# Patient Record
Sex: Female | Born: 1990 | Race: Black or African American | Hispanic: No | Marital: Single | Smoking: Current every day smoker
Health system: Southern US, Community
[De-identification: ages and names within clinical notes are randomized; demographics above are authoritative.]

## PROBLEM LIST (undated history)

## (undated) DIAGNOSIS — N611 Abscess of the breast and nipple: Secondary | ICD-10-CM

## (undated) DIAGNOSIS — E669 Obesity, unspecified: Secondary | ICD-10-CM

## (undated) DIAGNOSIS — E559 Vitamin D deficiency, unspecified: Secondary | ICD-10-CM

## (undated) HISTORY — DX: Vitamin D deficiency, unspecified: E55.9

## (undated) HISTORY — DX: Obesity, unspecified: E66.9

## (undated) HISTORY — PX: WISDOM TOOTH EXTRACTION: SHX21

---
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
## 2012-11-13 ENCOUNTER — Inpatient Hospital Stay (HOSPITAL_COMMUNITY)
Admission: AD | Admit: 2012-11-13 | Discharge: 2012-11-13 | Discharge status: Against Medical Advice | Payer: BC Managed Care – PPO | Source: Ambulatory Visit | Attending: Family Medicine | Admitting: Family Medicine

## 2012-11-13 DIAGNOSIS — N61 Mastitis without abscess: Secondary | ICD-10-CM | POA: Insufficient documentation

## 2012-11-13 NOTE — MAU Note (Signed)
Pt called from the lobby; No response.

## 2012-11-13 NOTE — MAU Note (Signed)
Pt called from lobby no response 

## 2012-11-13 NOTE — MAU Note (Signed)
Patient states she was diagnosed with an abscess on the left breast on 7-11. Was put on antibiotics and told to find someone to drain the abscess. Patient states she is having pain. Had a nipple piercing about 1 month ago on the left breast.

## 2012-11-13 NOTE — MAU Note (Signed)
Pt called to the lobby; no response.

## 2012-11-19 ENCOUNTER — Ambulatory Visit (INDEPENDENT_AMBULATORY_CARE_PROVIDER_SITE_OTHER): Payer: BC Managed Care – PPO | Admitting: Surgery

## 2012-11-19 ENCOUNTER — Encounter (HOSPITAL_COMMUNITY): Payer: Self-pay | Admitting: *Deleted

## 2012-11-19 ENCOUNTER — Encounter (INDEPENDENT_AMBULATORY_CARE_PROVIDER_SITE_OTHER): Payer: Self-pay | Admitting: Surgery

## 2012-11-19 ENCOUNTER — Other Ambulatory Visit (HOSPITAL_COMMUNITY): Payer: Self-pay | Admitting: *Deleted

## 2012-11-19 VITALS — BP 118/86 | HR 107 | Temp 96.8°F | Resp 16 | Ht 62.0 in | Wt 243.0 lb

## 2012-11-19 DIAGNOSIS — N611 Abscess of the breast and nipple: Secondary | ICD-10-CM | POA: Insufficient documentation

## 2012-11-19 DIAGNOSIS — N61 Mastitis without abscess: Secondary | ICD-10-CM

## 2012-11-19 NOTE — Progress Notes (Signed)
General Surgery - [facility]  Chief Complaint  Patient presents with  . New Evaluation    left breast abscess    HISTORY: Patient is a 22-year-old female who presents with a left breast abscess. Patient apparently had a piercing performed of the left nipple in early June 2014. She developed symptoms in early July and presented to student [facility] on 11/08/2012. She was started on oral Bactrim and referred to radiology. In [place], [place], she underwent an ultrasound of the left breast which showed a multiloculated extensive abscess.  The patient returned to [place] and was seen at [facility] on 11/09/2012.  She was referred to surgery for evaluation and management. Apparently, do to scheduling constraints, the patient did not come to our office until today. She continues to take Bactrim twice daily. She has left the piercing in place in the left nipple.  History reviewed. No pertinent past medical history.  Current Outpatient Prescriptions  Medication Sig Dispense Refill  . norgestimate-ethinyl estradiol (ORTHO-CYCLEN,SPRINTEC,PREVIFEM) 0.25-35 MG-MCG tablet Take 1 tablet by mouth daily.      . sulfamethoxazole-trimethoprim (BACTRIM DS) 800-160 MG per tablet Take 1 tablet by mouth 2 (two) times daily.       No current facility-administered medications for this visit.    No Known Allergies  History reviewed. No pertinent family history.  History   Social History  . Marital Status: Single    Spouse Name: N/A    Number of Children: N/A  . Years of Education: N/A   Social History Main Topics  . Smoking status: Current Every Day Smoker    Types: Cigarettes  . Smokeless tobacco: None  . Alcohol Use: Yes     Comment: occ  . Drug Use: No  . Sexually Active: None   Other Topics Concern  . None   Social History Narrative  . None    REVIEW OF SYSTEMS - PERTINENT POSITIVES ONLY: Patient notes some discomfort in the left breast. No prior history  of breast disease. No drainage. She denies fevers or chills.  EXAM: Filed Vitals:   11/19/12 1532  BP: 118/86  Pulse: 107  Temp: 96.8 F (36 C)  Resp: 16    HEENT: normocephalic; pupils equal and reactive; sclerae clear; dentition good; mucous membranes moist NECK:  symmetric on extension; no palpable anterior or posterior cervical lymphadenopathy; no supraclavicular masses; no tenderness CHEST: clear to auscultation bilaterally without rales, rhonchi, or wheezes CARDIAC: regular rate and rhythm without significant murmur; peripheral pulses are full BREAST: Right breast shows a normal nipple areolar complex; breast tissue is diffusely nodular without discrete or dominant mass; no tenderness. Left breast has a piercing in the nipple; left breast is approximately twice the size of the right breast; there is diffuse induration; there is a fluctuant area at the lateral aspect of the left areola; there is no drainage; there is mild tenderness EXT:  non-tender without edema; no deformity NEURO: no gross focal deficits; no sign of tremor   LABORATORY RESULTS: See [facility] (CHL-Epic) for most recent results  RADIOLOGY RESULTS: See [facility] (CHL-Epic) for most recent results  IMPRESSION: Left breast abscess, extensive, multiloculated  PLAN: I discussed with the patient the need for surgical drainage and debridement. I explained that this would need to be done under general anesthesia. She will likely require open wound dressing changes postoperatively.  I discussed the patient with my partner, Dr. Matthew Martin, who will perform the procedure tomorrow. We will make arrangements for   surgery.  The risks and benefits of the procedure have been discussed at length with the patient.  The patient understands the proposed procedure, potential alternative treatments, and the course of recovery to be expected.  All of the patient's questions have been answered at this time.  The  patient wishes to proceed with surgery.  Geneen Dieter M. Hermila Millis, MD, FACS General & Endocrine Surgery [facility]  Primary Care Physician: No primary provider on file.   

## 2012-11-19 NOTE — Patient Instructions (Signed)
°

## 2012-11-20 ENCOUNTER — Ambulatory Visit (HOSPITAL_COMMUNITY)
Admission: RE | Admit: 2012-11-20 | Discharge: 2012-11-20 | Disposition: A | Discharge status: Home / Self Care | Payer: BC Managed Care – PPO | Source: Ambulatory Visit | Attending: Surgery | Admitting: Surgery

## 2012-11-20 ENCOUNTER — Encounter (HOSPITAL_COMMUNITY): Payer: Self-pay | Admitting: *Deleted

## 2012-11-20 ENCOUNTER — Encounter (HOSPITAL_COMMUNITY)
Admission: RE | Disposition: A | Discharge status: Home / Self Care | Payer: Self-pay | Source: Ambulatory Visit | Attending: Surgery

## 2012-11-20 ENCOUNTER — Encounter (HOSPITAL_COMMUNITY): Payer: Self-pay | Admitting: Anesthesiology

## 2012-11-20 ENCOUNTER — Ambulatory Visit (HOSPITAL_COMMUNITY): Payer: BC Managed Care – PPO | Admitting: Anesthesiology

## 2012-11-20 DIAGNOSIS — N61 Mastitis without abscess: Secondary | ICD-10-CM | POA: Insufficient documentation

## 2012-11-20 DIAGNOSIS — F17200 Nicotine dependence, unspecified, uncomplicated: Secondary | ICD-10-CM | POA: Insufficient documentation

## 2012-11-20 HISTORY — DX: Abscess of the breast and nipple: N61.1

## 2012-11-20 HISTORY — PX: INCISION AND DRAINAGE ABSCESS: SHX5864

## 2012-11-20 LAB — CBC
HCT: 37.5 % (ref 36.0–46.0)
Hemoglobin: 12 g/dL (ref 12.0–15.0)
MCHC: 32 g/dL (ref 30.0–36.0)
RBC: 4.59 MIL/uL (ref 3.87–5.11)
WBC: 11.2 10*3/uL — ABNORMAL HIGH (ref 4.0–10.5)

## 2012-11-20 SURGERY — INCISION AND DRAINAGE, ABSCESS
Anesthesia: General | Site: Breast | Laterality: Left | Wound class: Dirty or Infected

## 2012-11-20 MED ORDER — SODIUM CHLORIDE 0.9 % IJ SOLN: 3.0000 mL | Freq: Two times a day (BID) | INTRAMUSCULAR | Status: DC

## 2012-11-20 MED ORDER — LIDOCAINE HCL (CARDIAC) 20 MG/ML IV SOLN
INTRAVENOUS | Status: DC | PRN
  Administered 2012-11-20: 100 mg via INTRAVENOUS

## 2012-11-20 MED ORDER — KETAMINE HCL 10 MG/ML IJ SOLN
INTRAMUSCULAR | Status: DC | PRN
  Administered 2012-11-20: 20 mg via INTRAVENOUS

## 2012-11-20 MED ORDER — LACTATED RINGERS IV SOLN
INTRAVENOUS | Status: DC
  Administered 2012-11-20: 1000 mL via INTRAVENOUS
  Administered 2012-11-20: 12:00:00 via INTRAVENOUS

## 2012-11-20 MED ORDER — ONDANSETRON HCL 4 MG/2ML IJ SOLN
INTRAMUSCULAR | Status: DC | PRN
  Administered 2012-11-20: 4 mg via INTRAVENOUS

## 2012-11-20 MED ORDER — SODIUM CHLORIDE 0.9 % IV SOLN: 250.0000 mL | INTRAVENOUS | Status: DC | PRN

## 2012-11-20 MED ORDER — MIDAZOLAM HCL 5 MG/5ML IJ SOLN
INTRAMUSCULAR | Status: DC | PRN
  Administered 2012-11-20: 2 mg via INTRAVENOUS

## 2012-11-20 MED ORDER — 0.9 % SODIUM CHLORIDE (POUR BTL) OPTIME
TOPICAL | Status: DC | PRN
  Administered 2012-11-20: 1000 mL

## 2012-11-20 MED ORDER — FENTANYL CITRATE 0.05 MG/ML IJ SOLN
INTRAMUSCULAR | Status: DC | PRN
  Administered 2012-11-20: 50 ug via INTRAVENOUS
  Administered 2012-11-20: 100 ug via INTRAVENOUS
  Administered 2012-11-20: 50 ug via INTRAVENOUS

## 2012-11-20 MED ORDER — PROPOFOL 10 MG/ML IV BOLUS
INTRAVENOUS | Status: DC | PRN
  Administered 2012-11-20: 160 mg via INTRAVENOUS

## 2012-11-20 MED ORDER — HYDROMORPHONE HCL PF 1 MG/ML IJ SOLN
INTRAMUSCULAR | Status: AC
  Filled 2012-11-20: qty 1

## 2012-11-20 MED ORDER — OXYCODONE HCL 5 MG PO TABS: 5.0000 mg | ORAL_TABLET | ORAL | Status: DC | PRN

## 2012-11-20 MED ORDER — OXYCODONE-ACETAMINOPHEN 5-325 MG PO TABS: 1.0000 | ORAL_TABLET | ORAL | Status: DC | PRN

## 2012-11-20 MED ORDER — LACTATED RINGERS IV SOLN
INTRAVENOUS | Status: DC | PRN
  Administered 2012-11-20: 10:00:00 via INTRAVENOUS

## 2012-11-20 MED ORDER — ACETAMINOPHEN 325 MG PO TABS: 650.0000 mg | ORAL_TABLET | ORAL | Status: DC | PRN

## 2012-11-20 MED ORDER — ONDANSETRON HCL 4 MG/2ML IJ SOLN: 4.0000 mg | Freq: Four times a day (QID) | INTRAMUSCULAR | Status: DC | PRN

## 2012-11-20 MED ORDER — ACETAMINOPHEN 650 MG RE SUPP
650.0000 mg | RECTAL | Status: DC | PRN
  Filled 2012-11-20: qty 1

## 2012-11-20 MED ORDER — HYDROMORPHONE HCL PF 1 MG/ML IJ SOLN
0.2500 mg | INTRAMUSCULAR | Status: DC | PRN
  Administered 2012-11-20: 0.25 mg via INTRAVENOUS

## 2012-11-20 MED ORDER — PROMETHAZINE HCL 25 MG/ML IJ SOLN: 6.2500 mg | INTRAMUSCULAR | Status: DC | PRN

## 2012-11-20 MED ORDER — CEFAZOLIN SODIUM-DEXTROSE 2-3 GM-% IV SOLR
INTRAVENOUS | Status: AC
  Filled 2012-11-20: qty 50

## 2012-11-20 MED ORDER — SODIUM CHLORIDE 0.9 % IJ SOLN: 3.0000 mL | INTRAMUSCULAR | Status: DC | PRN

## 2012-11-20 MED ORDER — BUPIVACAINE-EPINEPHRINE PF 0.25-1:200000 % IJ SOLN
INTRAMUSCULAR | Status: AC
  Filled 2012-11-20: qty 30

## 2012-11-20 MED ORDER — CEFAZOLIN SODIUM-DEXTROSE 2-3 GM-% IV SOLR: 2.0000 g | INTRAVENOUS | Status: DC

## 2012-11-20 SURGICAL SUPPLY — 33 items
BANDAGE GAUZE ELAST BULKY 4 IN (GAUZE/BANDAGES/DRESSINGS) IMPLANT
BLADE SURG 15 STRL LF DISP TIS (BLADE) ×1 IMPLANT
BLADE SURG 15 STRL SS (BLADE) ×2
CANISTER SUCTION 2500CC (MISCELLANEOUS) ×2 IMPLANT
CLOTH BEACON ORANGE TIMEOUT ST (SAFETY) ×2 IMPLANT
COVER SURGICAL LIGHT HANDLE (MISCELLANEOUS) ×2 IMPLANT
DECANTER SPIKE VIAL GLASS SM (MISCELLANEOUS) IMPLANT
DRAPE LAPAROSCOPIC ABDOMINAL (DRAPES) IMPLANT
DRSG PAD ABDOMINAL 8X10 ST (GAUZE/BANDAGES/DRESSINGS) IMPLANT
ELECT CAUTERY BLADE 6.4 (BLADE) ×2 IMPLANT
ELECT REM PT RETURN 9FT ADLT (ELECTROSURGICAL) ×2
ELECTRODE REM PT RTRN 9FT ADLT (ELECTROSURGICAL) ×1 IMPLANT
GAUZE PACKING IODOFORM 1/2 (PACKING) ×1 IMPLANT
GLOVE BIO SURGEON STRL SZ7.5 (GLOVE) ×2 IMPLANT
GOWN STRL NON-REIN LRG LVL3 (GOWN DISPOSABLE) ×4 IMPLANT
KIT BASIN OR (CUSTOM PROCEDURE TRAY) ×2 IMPLANT
NDL HYPO 25X1 1.5 SAFETY (NEEDLE) IMPLANT
NEEDLE HYPO 25X1 1.5 SAFETY (NEEDLE) IMPLANT
NS IRRIG 1000ML POUR BTL (IV SOLUTION) ×2 IMPLANT
PENCIL BUTTON HOLSTER BLD 10FT (ELECTRODE) ×2 IMPLANT
SPONGE GAUZE 4X4 12PLY (GAUZE/BANDAGES/DRESSINGS) ×1 IMPLANT
SPONGE LAP 18X18 X RAY DECT (DISPOSABLE) ×2 IMPLANT
SUT MNCRL AB 4-0 PS2 18 (SUTURE) IMPLANT
SUT VIC AB 3-0 SH 27 (SUTURE)
SUT VIC AB 3-0 SH 27XBRD (SUTURE) IMPLANT
SWAB COLLECTION DEVICE MRSA (MISCELLANEOUS) IMPLANT
SYR BULB 3OZ (MISCELLANEOUS) ×2 IMPLANT
SYR CONTROL 10ML LL (SYRINGE) IMPLANT
TAPE CLOTH SURG 4X10 WHT LF (GAUZE/BANDAGES/DRESSINGS) ×1 IMPLANT
TOWEL OR 17X26 10 PK STRL BLUE (TOWEL DISPOSABLE) ×2 IMPLANT
TUBE ANAEROBIC SPECIMEN COL (MISCELLANEOUS) IMPLANT
WATER STERILE IRR 1000ML POUR (IV SOLUTION) IMPLANT
YANKAUER SUCT BULB TIP NO VENT (SUCTIONS) ×2 IMPLANT

## 2012-11-20 NOTE — Anesthesia Postprocedure Evaluation (Signed)
Anesthesia Post Note  Patient: Kristie [facility]  Procedure(s) Performed: Procedure(s) (LRB): INCISION AND DRAINAGE LEFT BREAST ABSCESS (Left)  Anesthesia type: General  Patient location: PACU  Post pain: Pain level controlled  Post assessment: Post-op Vital signs reviewed  Last Vitals: BP 123/82  Pulse 68  Temp(Src) 36.2 C (Oral)  Resp 16  Ht 5\' 1"  (1.549 m)  Wt 242 lb 8 oz (109.997 kg)  BMI 45.84 kg/m2  SpO2 99%  LMP 11/17/2012  Post vital signs: Reviewed  Level of consciousness: sedated  Complications: No apparent anesthesia complications

## 2012-11-20 NOTE — Op Note (Signed)
Surgeon: Wenda Low, MD, FACS  Asst:  none  Anes:  general  Procedure: Incision and drainage and culture of large left breast abscess;  Removal of nipple piercing;  Packed with 1/2 inch Iodophor  Diagnosis: Left breast abscess  Complications: none  EBL:   minimal cc  Description of Procedure:  Taken to room 6.  Timeout after nipple ring removed without difficulty.  Prepped with PCMX and 18 gauge aspiration for yellow thick pus performed and aerobic and anaerobic cultures taken.  Incision and large quatitiy of yellow non foul smelling pus removed.  Irrigated with saline and packed with 1/2 inch Iodophor.    Plan: Discharge to home with pain meds and continue antibiotics.  Return to Urgent Office on Thursday for packing removal.    [name], MD, [facility], [place] [phone]

## 2012-11-20 NOTE — Interval H&P Note (Signed)
History and Physical Interval Note:  11/20/2012 10:07 AM  Kristie [facility]  has presented today for surgery, with the diagnosis of left breast abscess  The various methods of treatment have been discussed with the patient and family. After consideration of risks, benefits and other options for treatment, the patient has consented to  Procedure(s): INCISION AND DRAINAGE LEFT BREAST ABSCESS (Left) as a surgical intervention .  The patient's history has been reviewed, patient examined, no change in status, stable for surgery.  I have reviewed the patient's chart and labs.  Questions were answered to the patient's satisfaction.     Bevely Hackbart B

## 2012-11-20 NOTE — Anesthesia Preprocedure Evaluation (Addendum)
Anesthesia Evaluation  Patient identified by MRN, date of birth, ID band Patient awake    Reviewed: Allergy & Precautions, H&P , NPO status , Patient's Chart, lab work & pertinent test results  Airway Mallampati: II TM Distance: >3 FB Neck ROM: Full    Dental no notable dental hx. (+) Dental Advisory Given   Pulmonary Current Smoker,  breath sounds clear to auscultation  Pulmonary exam normal       Cardiovascular Exercise Tolerance: Good negative cardio ROS  Rhythm:Regular Rate:Normal     Neuro/Psych negative neurological ROS  negative psych ROS   GI/Hepatic negative GI ROS, Neg liver ROS,   Endo/Other  Morbid obesity  Renal/GU negative Renal ROS     Musculoskeletal negative musculoskeletal ROS (+)   Abdominal (+) + obese,   Peds  Hematology negative hematology ROS (+)   Anesthesia Other Findings   Reproductive/Obstetrics negative OB ROS                          Anesthesia Physical Anesthesia Plan  ASA: III  Anesthesia Plan: General   Post-op Pain Management:    Induction: Intravenous  Airway Management Planned: LMA  Additional Equipment:   Intra-op Plan:   Post-operative Plan: Extubation in OR  Informed Consent: I have reviewed the patients History and Physical, chart, labs and discussed the procedure including the risks, benefits and alternatives for the proposed anesthesia with the patient or authorized representative who has indicated his/her understanding and acceptance.   Dental advisory given  Plan Discussed with: CRNA  Anesthesia Plan Comments:        Anesthesia Quick Evaluation

## 2012-11-20 NOTE — H&P (View-Only) (Signed)
[facility]  Chief Complaint  Patient presents with  . New Evaluation    left breast abscess    HISTORY: Patient is a 22 year old female who presents with a left breast abscess. Patient apparently had a piercing performed of the left nipple in early June 2014. She developed symptoms in early July and presented to student health on 11/08/2012. She was started on oral Bactrim and referred to radiology. In [place], [place], she underwent an ultrasound of the left breast which showed a multiloculated extensive abscess.  The patient returned to [place] [facility] and was seen at [facility] on 11/09/2012.  She was referred to surgery for evaluation and management. Apparently, do to scheduling constraints, the patient did not come to our office until today. She continues to take Bactrim twice daily. She has left the piercing in place in the left nipple.  History reviewed. No pertinent past medical history.  Current Outpatient Prescriptions  Medication Sig Dispense Refill  . norgestimate-ethinyl estradiol (ORTHO-CYCLEN,SPRINTEC,PREVIFEM) 0.25-35 MG-MCG tablet Take 1 tablet by mouth daily.      Marland Kitchen sulfamethoxazole-trimethoprim (BACTRIM DS) 800-160 MG per tablet Take 1 tablet by mouth 2 (two) times daily.       No current facility-administered medications for this visit.    No Known Allergies  History reviewed. No pertinent family history.  History   Social History  . Marital Status: Single    Spouse Name: N/A    Number of Children: N/A  . Years of Education: N/A   Social History Main Topics  . Smoking status: Current Every Day Smoker    Types: Cigarettes  . Smokeless tobacco: None  . Alcohol Use: Yes     Comment: occ  . Drug Use: No  . Sexually Active: None   Other Topics Concern  . None   Social History Narrative  . None    REVIEW OF SYSTEMS - PERTINENT POSITIVES ONLY: Patient notes some discomfort in the left breast. No prior history  of breast disease. No drainage. She denies fevers or chills.  EXAM: Filed Vitals:   11/19/12 1532  BP: 118/86  Pulse: 107  Temp: 96.8 F (36 C)  Resp: 16    HEENT: normocephalic; pupils equal and reactive; sclerae clear; dentition good; mucous membranes moist NECK:  symmetric on extension; no palpable anterior or posterior cervical lymphadenopathy; no supraclavicular masses; no tenderness CHEST: clear to auscultation bilaterally without rales, rhonchi, or wheezes CARDIAC: regular rate and rhythm without significant murmur; peripheral pulses are full BREAST: Right breast shows a normal nipple areolar complex; breast tissue is diffusely nodular without discrete or dominant mass; no tenderness. Left breast has a piercing in the nipple; left breast is approximately twice the size of the right breast; there is diffuse induration; there is a fluctuant area at the lateral aspect of the left areola; there is no drainage; there is mild tenderness EXT:  non-tender without edema; no deformity NEURO: no gross focal deficits; no sign of tremor   LABORATORY RESULTS: See [facility] (CHL-Epic) for most recent results  RADIOLOGY RESULTS: See [facility] (CHL-Epic) for most recent results  IMPRESSION: Left breast abscess, extensive, multiloculated  PLAN: I discussed with the patient the need for surgical drainage and debridement. I explained that this would need to be done under general anesthesia. She will likely require open wound dressing changes postoperatively.  I discussed the patient with my partner, Dr. Luretha Murphy, who will perform the procedure tomorrow. We will make arrangements for  surgery.  The risks and benefits of the procedure have been discussed at length with the patient.  The patient understands the proposed procedure, potential alternative treatments, and the course of recovery to be expected.  All of the patient's questions have been answered at this time.  The  patient wishes to proceed with surgery.  Velora Heckler, MD, FACS General & Endocrine Surgery [facility]  Primary Care Physician: No primary provider on file.

## 2012-11-20 NOTE — Transfer of Care (Signed)
Immediate Anesthesia Transfer of Care Note  Patient: Kristie [facility]  Procedure(s) Performed: Procedure(s): INCISION AND DRAINAGE LEFT BREAST ABSCESS (Left)  Patient Location: PACU  Anesthesia Type:General  Level of Consciousness: sedated  Airway & Oxygen Therapy: Patient Spontanous Breathing and Patient connected to face mask oxygen  Post-op Assessment: Report given to PACU RN and Post -op Vital signs reviewed and stable  Post vital signs: Reviewed and stable  Complications: No apparent anesthesia complications

## 2012-11-21 ENCOUNTER — Encounter (HOSPITAL_COMMUNITY): Payer: Self-pay | Admitting: Surgery

## 2012-11-22 ENCOUNTER — Encounter (INDEPENDENT_AMBULATORY_CARE_PROVIDER_SITE_OTHER): Payer: Self-pay | Admitting: General Surgery

## 2012-11-22 ENCOUNTER — Ambulatory Visit (INDEPENDENT_AMBULATORY_CARE_PROVIDER_SITE_OTHER): Payer: BC Managed Care – PPO | Admitting: General Surgery

## 2012-11-22 VITALS — BP 103/62 | HR 64 | Temp 97.7°F | Resp 14 | Ht 62.0 in | Wt 241.0 lb

## 2012-11-22 DIAGNOSIS — N61 Mastitis without abscess: Secondary | ICD-10-CM

## 2012-11-22 DIAGNOSIS — N611 Abscess of the breast and nipple: Secondary | ICD-10-CM

## 2012-11-22 NOTE — Progress Notes (Signed)
Kristie Wolf is a 22 y.o. female who is here for a follow up visit regarding her L breast abscess.  She was found to have an abscess and was taken to the OR for drainage.  She is here today for packing removal.    Objective: Filed Vitals:   11/22/12 1635  BP: 103/62  Pulse: 64  Temp: 97.7 F (36.5 C)  Resp: 14    General appearance: alert and cooperative L breast without erythema or fluctuance.  Packing removed without difficulty   Assessment and Plan: Appears to be healing well.  Pt given instructions to wash area with soap and water daily and keep covered.  F/U with Dr Daphine Deutscher in 2 weeks.      Vanita Panda, MD [facility], [place] [phone]

## 2012-11-22 NOTE — Patient Instructions (Signed)
Keep area covered until it stops draining.  Ok to shower but no tub baths or swimming for 2 weeks.  Call the office if you pain gets worse or you start to run fevers or have increased drainage.

## 2012-11-23 LAB — CULTURE, ROUTINE-ABSCESS

## 2012-11-25 LAB — ANAEROBIC CULTURE

## 2012-12-07 ENCOUNTER — Encounter (INDEPENDENT_AMBULATORY_CARE_PROVIDER_SITE_OTHER): Payer: Self-pay | Admitting: Surgery

## 2012-12-07 ENCOUNTER — Ambulatory Visit (INDEPENDENT_AMBULATORY_CARE_PROVIDER_SITE_OTHER): Payer: BC Managed Care – PPO | Admitting: Surgery

## 2012-12-07 VITALS — BP 112/58 | HR 72 | Resp 14 | Ht 63.5 in | Wt 245.4 lb

## 2012-12-07 DIAGNOSIS — Z872 Personal history of diseases of the skin and subcutaneous tissue: Secondary | ICD-10-CM

## 2012-12-07 NOTE — Patient Instructions (Addendum)
Keep Hibiclens on hand to wash armpits, beneath breasts, groins when hot & sweaty.

## 2012-12-07 NOTE — Progress Notes (Signed)
Kristie Wolf 22 y.o.  Body mass index is 42.78 kg/(m^2).  There are no active problems to display for this patient.   No Known Allergies  Past Surgical History  Procedure Laterality Date  . Incision and drainage abscess Left 11/20/2012    Procedure: INCISION AND DRAINAGE LEFT BREAST ABSCESS;  Surgeon: Valarie Merino, MD;  Location: [facility] ORS;  Service: General;  Laterality: Left;   No primary provider on file. No diagnosis found.  Left breast where abscess was has healed and is no longer draining.  Nontender to palpation.   Doing well.  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   [name], MD, [facility] [phone] beeper [phone]  12/07/2012 3:29 PM

## 2018-10-29 ENCOUNTER — Encounter: Attending: Family Medicine | Primary: Family Medicine

## 2018-11-07 ENCOUNTER — Ambulatory Visit: Attending: Family Medicine | Primary: Family Medicine

## 2018-11-07 ENCOUNTER — Inpatient Hospital Stay: Admit: 2018-11-08 | Payer: BLUE CROSS/BLUE SHIELD | Primary: Family Medicine

## 2018-11-07 ENCOUNTER — Ambulatory Visit: Admit: 2018-11-07 | Payer: PRIVATE HEALTH INSURANCE | Attending: Family Medicine | Primary: Family Medicine

## 2018-11-07 ENCOUNTER — Inpatient Hospital Stay: Admit: 2018-11-07 | Payer: BLUE CROSS/BLUE SHIELD | Primary: Family Medicine

## 2018-11-07 DIAGNOSIS — Z01419 Encounter for gynecological examination (general) (routine) without abnormal findings: Secondary | ICD-10-CM

## 2018-11-07 LAB — BASIC METABOLIC PANEL
Anion Gap: 4 mmol/L — ABNORMAL LOW (ref 5–15)
BUN: 8 MG/DL (ref 6–20)
Bun/Cre Ratio: 11 — ABNORMAL LOW (ref 12–20)
CO2: 24 mmol/L (ref 21–32)
Calcium: 8.2 MG/DL — ABNORMAL LOW (ref 8.5–10.1)
Chloride: 107 mmol/L (ref 97–108)
Creatinine: 0.7 MG/DL (ref 0.55–1.02)
EGFR IF NonAfrican American: >60 mL/min/{1.73_m2} (ref >60)
GFR African American: >60 mL/min/{1.73_m2} (ref >60)
Glucose: 67 mg/dL (ref 65–100)
Potassium: 4.4 mmol/L (ref 3.5–5.1)
Sodium: 135 mmol/L — ABNORMAL LOW (ref 136–145)

## 2018-11-07 LAB — LIPID PANEL
CHOL/HDL Ratio: 3.2 (ref 0.0–5.0)
Chol/HDL Ratio: 3.2 (ref 0.0–5.0)
Cholesterol, Total: 176 MG/DL (ref <200)
Cholesterol, total: 176 MG/DL (ref <200)
HDL Cholesterol: 55 MG/DL
HDL: 55 MG/DL
LDL Calculated: 105.8 MG/DL — ABNORMAL HIGH (ref 0–100)
LDL, calculated: 105.8 MG/DL — ABNORMAL HIGH (ref 0–100)
Triglyceride: 76 MG/DL (ref <150)
Triglycerides: 76 MG/DL (ref <150)
VLDL Cholesterol Calculated: 15.2 MG/DL
VLDL, calculated: 15.2 MG/DL

## 2018-11-07 LAB — METABOLIC PANEL, BASIC
Anion gap: 4 mmol/L — ABNORMAL LOW (ref 5–15)
BUN/Creatinine ratio: 11 — ABNORMAL LOW (ref 12–20)
BUN: 8 MG/DL (ref 6–20)
CO2: 24 mmol/L (ref 21–32)
Calcium: 8.2 MG/DL — ABNORMAL LOW (ref 8.5–10.1)
Chloride: 107 mmol/L (ref 97–108)
Creatinine: 0.7 MG/DL (ref 0.55–1.02)
GFR est AA: >60 mL/min/{1.73_m2} (ref >60)
GFR est non-AA: >60 mL/min/{1.73_m2} (ref >60)
Glucose: 67 mg/dL (ref 65–100)
Potassium: 4.4 mmol/L (ref 3.5–5.1)
Sodium: 135 mmol/L — ABNORMAL LOW (ref 136–145)

## 2018-11-07 MED ORDER — FLUCONAZOLE 150 MG TAB
150 mg | ORAL_TABLET | Freq: Every day | ORAL | 0 refills | Status: AC
Start: 2018-11-07 — End: 2018-11-08

## 2018-11-07 NOTE — Progress Notes (Signed)
No additional comment

## 2018-11-07 NOTE — Progress Notes (Signed)
Identified pt with two pt identifiers(name and DOB). Reviewed record in preparation for visit and have obtained necessary documentation.  Chief Complaint   Patient presents with   ??? Establish Care   ??? Physical        Health Maintenance Due   Topic   ??? HPV Age 9Y-26Y (1 - 2-dose series)   ??? DTaP/Tdap/Td series (1 - Tdap)   ??? PAP AKA CERVICAL CYTOLOGY        Coordination of Care Questionnaire:  :   1) Have you been to an emergency room, urgent care, or hospitalized since your last visit?  If yes, where when, and reason for visit? no      2. Have seen or consulted any other health care provider since your last visit?   If yes, where when, and reason for visit?  no        Patient is accompanied by self  I have received verbal consent from Mindy Kirk to discuss any/all medical information while they are present in the room.

## 2018-11-07 NOTE — Progress Notes (Signed)
[facility]        Well Woman Check Up       Subjective:     28 y.o. African American F for routine annual exam    Pertinent past medical history: no history of HTN, DVT, CAD, DM, liver disease, migraines or smoking.    LMP:Patient's last menstrual period was 10/09/2018 (exact date).  Menses:regular       PAP History:   Due today    Colonoscopy: No, routine screening    Mammogram:  Due at 42    DEXA: due at 67    Lipids:  Due now      Family History   Problem Relation Age of Onset   ??? No Known Problems Mother    ??? Diabetes Father    ??? Hypertension Father    ??? Diabetes Maternal Grandfather    ??? Cancer Paternal Grandfather         bone cancer   ??? Colon Cancer Neg Hx    ??? Breast Cancer Neg Hx      Social History     Tobacco Use   ??? Smoking status: Current Every Day Smoker     Packs/day: 4.00   ??? Smokeless tobacco: Current User   Substance Use Topics   ??? Alcohol use: Yes     Alcohol/week: 1.0 standard drinks     Types: 1 Shots of liquor per week     Comment: socially        ROS:  Feeling well. No dyspnea or chest pain on exertion.  No abdominal pain, change in bowel habits, black or bloody stools.  No urinary tract symptoms. GYN ROS: normal menses, no abnormal bleeding, pelvic pain or discharge, no breast pain or new or enlarging lumps on self exam. No neurological complaints.    Objective:     Visit Vitals  BP 97/65   Pulse 73   Temp 98.7 ??F (37.1 ??C) (Oral)   Resp 16   Ht 5' 3" (1.6 m)   Wt 278 lb 3.2 oz (126.2 kg)   LMP 10/09/2018 (Exact Date)   SpO2 96%   BMI 49.28 kg/m??     Gen:  The patient appears well, alert, oriented x 3, in no distress.  HEENT:  Normal, atraumatic, PERLA.  Neck: supple. No adenopathy or thyromegaly.   Lungs:  clear, good air entry, no wheezes, rhonchi or rales.   CV: S1 and S2 normal, no murmurs, regular rate and rhythm.  Abdomen: soft without tenderness, guarding, mass or organomegaly.   Extremities:  no edema, normal peripheral pulses.    Neurological:normal, no focal findings.    BREAST EXAM: breasts appear normal, no suspicious masses, no skin or nipple changes or axillary nodes.    PELVIC EXAM: normal external genitalia, vulva, vagina, cervix, uterus and adnexa +vaginal discharge, white thick exam chaperoned by: Mindy Kirk, CMA    Assessment/Plan:     Encounter Diagnoses     ICD-10-CM ICD-9-CM   1. Well woman exam with routine gynecological exam Z01.419 V72.31   2. Screen for STD (sexually transmitted disease) Z11.3 V74.5   3. Encounter for Papanicolaou smear for cervical cancer screening Z12.4 V76.2   4. Screening for lipid disorders Z13.220 V77.91   5. Class 3 severe obesity due to excess calories without serious comorbidity with body mass index (BMI) of 45.0 to 49.9 in adult (HCC) E66.01 278.01    Z68.42 V85.42       1. Well woman exam   with routine gynecological exam  Patient has famhx od diabetes and obese so she is at higher risk for developing dm. Advised patient will check blood sugar with fasting labs today.   - METABOLIC PANEL, BASIC; Future  - LIPID PANEL; Future    2. Screen for STD (sexually transmitted disease)  - CT/NG/T.VAGINALIS AMPLIFICATION; Future  - HIV 1/2 AG/AB, 4TH GENERATION,W RFLX CONFIRM; Future  - T PALLIDUM SCREEN W/REFLEX; Future  - HBV CORE AB, IGG/IGM; Future    3. Encounter for Papanicolaou smear for cervical cancer screening  - PAP IG, RFX APTIMA HPV ASCUS ([id number])); Future    4. Screening for lipid disorders  - LIPID PANEL; Future    5. Class 3 severe obesity due to excess calories without serious comorbidity with body mass index (BMI) of 45.0 to 49.9 in adult [facility])  I have reviewed/discussed the above normal BMI with the patient.  I have recommended the following interventions: dietary management education, guidance, and counseling, encourage exercise and prescribed dietary intake . Marland Kitchen       Bonne Dolores, MD  11/07/18        No future appointments.

## 2018-11-07 NOTE — Patient Instructions (Addendum)
Well Visit, Ages 26 to 50: Care Instructions  Your Care Instructions     Physical exams can help you stay healthy. Your doctor has checked your overall health and may have suggested ways to take good care of yourself. He or she also may have recommended tests. At home, you can help prevent illness with healthy eating, regular exercise, and other steps.  Follow-up care is a key part of your treatment and safety. Be sure to make and go to all appointments, and call your doctor if you are having problems. It's also a good idea to know your test results and keep a list of the medicines you take.  How can you care for yourself at home?  ?? Reach and stay at a healthy weight. This will lower your risk for many problems, such as obesity, diabetes, heart disease, and high blood pressure.  ?? Get at least 30 minutes of physical activity on most days of the week. Walking is a good choice. You also may want to do other activities, such as running, swimming, cycling, or playing tennis or team sports. Discuss any changes in your exercise program with your doctor.  ?? Do not smoke or allow others to smoke around you. If you need help quitting, talk to your doctor about stop-smoking programs and medicines. These can increase your chances of quitting for good.  ?? Talk to your doctor about whether you have any risk factors for sexually transmitted infections (STIs). Having one sex partner (who does not have STIs and does not have sex with anyone else) is a good way to avoid these infections.  ?? Use birth control if you do not want to have children at this time. Talk with your doctor about the choices available and what might be best for you.  ?? Protect your skin from too much sun. When you're outdoors from 10 a.m. to 4 p.m., stay in the shade or cover up with clothing and a hat with a wide brim. Wear sunglasses that block UV rays. Even when it's cloudy, put broad-spectrum sunscreen (SPF 30 or higher) on any exposed skin.   ?? See a dentist one or two times a year for checkups and to have your teeth cleaned.  ?? Wear a seat belt in the car.  Follow your doctor's advice about when to have certain tests. These tests can spot problems early.  For everyone  ?? Cholesterol. Have the fat (cholesterol) in your blood tested after age 20. Your doctor will tell you how often to have this done based on your age, family history, or other things that can increase your risk for heart disease.  ?? Blood pressure. Have your blood pressure checked during a routine doctor visit. Your doctor will tell you how often to check your blood pressure based on your age, your blood pressure results, and other factors.  ?? Vision. Talk with your doctor about how often to have a glaucoma test.  ?? Diabetes. Ask your doctor whether you should have tests for diabetes.  ?? Colon cancer. Your risk for colorectal cancer gets higher as you get older. Some experts say that adults should start regular screening at age 50 and stop at age 75. Others say to start before age 50 or continue after age 75. Talk with your doctor about your risk and when to start and stop screening.  For women  ?? Breast exam and mammogram. Talk to your doctor about when you should have a clinical breast exam and   a mammogram. Medical experts differ on whether and how often women under 50 should have these tests. Your doctor can help you decide what is right for you.  ?? Cervical cancer screening test and pelvic exam. Begin with a Pap test at age 26. The test often is part of a pelvic exam. Starting at age 26, you may choose to have a Pap test, an HPV test, or both tests at the same time (called co-testing). Talk with your doctor about how often to have testing.  ?? Tests for sexually transmitted infections (STIs). Ask whether you should have tests for STIs. You may be at risk if you have sex with more than one person, especially if your partners do not wear condoms.  For men   ?? Tests for sexually transmitted infections (STIs). Ask whether you should have tests for STIs. You may be at risk if you have sex with more than one person, especially if you do not wear a condom.  ?? Testicular cancer exam. Ask your doctor whether you should check your testicles regularly.  ?? Prostate exam. Talk to your doctor about whether you should have a blood test (called a PSA test) for prostate cancer. Experts differ on whether and when men should have this test. Some experts suggest it if you are older than 2645 and are African-American or have a father or brother who got prostate cancer when he was younger than 2625.  When should you call for help?  Watch closely for changes in your health, and be sure to contact your doctor if you have any problems or symptoms that concern you.  Where can you learn more?  Go to InsuranceStats.cahttp://www.healthwise.net/GoodHelpConnections  Enter P072 in the search box to learn more about "Well Visit, Ages 2618 to 4450: Care Instructions."  Current as of: December 21, 2017??????????????????????????????Content Version: 12.5  ?? 2006-2020 [employer]wise[employer].   Care instructions adapted under license by [employer] (which disclaims liability or warranty for this information). If you have questions about a medical condition or this instruction, always ask your healthcare professional. Healthwise, Incorporated disclaims any warranty or liability for your use of this information.    Weight Loss Diet Guidelines      Eat ONLY when you are hungry, and stop just before you are full.    If you are eating enough fat in your meals, you will feel full for 5-6 hours after, and you can avoid snacks. This is your goal.     Eat More of these Foods:    Meat: Beef, lamb, pork, chicken and others  Fish: Any kind of fish  Eggs: As many as you want, with the yolk  Vegetables: ANY vegetables but limit starchy vegetables like potatoes, corn, carrots or peas  Nuts and Seeds: No more than one handful a day   High-fat Dairy: Cheese, butter, heavy cream      Eat Less of this, but OK Rarely:    Fruits: Berries and Melon are best. May have one handful per day. Limit other fruits, particularly bananas, grapes, mangos, and pineapple  DO NOT DRINK ANY JUICE, EVER.  Whole grains: Oatmeal, quinoa, brown rice  Legumes: Beans, lentils      Do Not Eat these Foods:    Drinks with calories: Sodas, fruit juices, sweet tea, sports drinks (gatorade, etc)  Sugar: Honey, agave, candy, cake, cookies, ice cream  Grains: Bread, pasta, crackers, cereal, chips  Yogurt: Most yogurt is as bad as candy. Eat only high-fat, plain yogurt,  like Fage 2% plain.   Trans Fats: "Hydrogenated" or "partially hydrogenated" oils, margarine  "Diet" and "Low fat" Products  Highly processed foods. If it looks like it was made in a factory, don't eat it. If it has more than 5 ingredients, it is processed.       What Should Meals Look Like?    Breakfast: Tomasa Blase, eggs, sausage or plain yogurt with berries  Lunch: Big salad with meat, cheese, avocado, homemade dressing or olive oil and vinegar. Or meat, chicken, fish and vegetables.  Dinner: Meat, chicken or fish and vegetables, or salad, like above  Snack: Berries, cheese, nuts  Drinks:Plenty of water. May also have coffee, tea, or artificially sweetened beverages (but not too many)

## 2018-11-07 NOTE — Progress Notes (Signed)
[place] [facility]        Well Woman Check Up       Subjective:     28 y.o. African American F for routine annual exam    Pertinent past medical history: no history of HTN, DVT, CAD, DM, liver disease, migraines or smoking.    [id number]:Patient's last menstrual period was 10/09/2018 (exact date).  Menses:regular       PAP History:   Due today    Colonoscopy: No, routine screening    Mammogram:  Due at 42    DEXA: due at [age]    Lipids:  Due now      Family History   Problem Relation Age of Onset   ??? No Known Problems Mother    ??? Diabetes Father    ??? Hypertension Father    ??? Diabetes Maternal Grandfather    ??? Cancer Paternal Grandfather         bone cancer   ??? Colon Cancer Neg Hx    ??? Breast Cancer Neg Hx      Social History     Tobacco Use   ??? Smoking status: Current Every Day Smoker     Packs/day: 4.00   ??? Smokeless tobacco: Current User   Substance Use Topics   ??? Alcohol use: Yes     Alcohol/week: 1.0 standard drinks     Types: 1 Shots of liquor per week     Comment: socially        ROS:  Feeling well. No dyspnea or chest pain on exertion.  No abdominal pain, change in bowel habits, black or bloody stools.  No urinary tract symptoms. GYN ROS: normal menses, no abnormal bleeding, pelvic pain or discharge, no breast pain or new or enlarging lumps on self exam. No neurological complaints.    Objective:     Visit Vitals  BP 97/65   Pulse 73   Temp 98.7 ??F (37.1 ??C) (Oral)   Resp 16   Ht 5\' 3"  (1.6 m)   Wt 278 lb 3.2 oz (126.2 kg)   LMP 10/09/2018 (Exact Date)   SpO2 96%   BMI 49.28 kg/m??     Gen:  The patient appears well, alert, oriented x 3, in no distress.  HEENT:  Normal, atraumatic, PERLA.  Neck: supple. No adenopathy or thyromegaly.   Lungs:  clear, good air entry, no wheezes, rhonchi or rales.   CV: S1 and S2 normal, no murmurs, regular rate and rhythm.  Abdomen: soft without tenderness, guarding, mass or organomegaly.   Extremities:  no edema, normal peripheral pulses.    Neurological:normal, no focal findings.    BREAST EXAM: breasts appear normal, no suspicious masses, no skin or nipple changes or axillary nodes.    PELVIC EXAM: normal external genitalia, vulva, vagina, cervix, uterus and adnexa +vaginal discharge, white thick exam chaperoned by: Levy Sjogrenherrelle Hayes, CMA    Assessment/Plan:     Encounter Diagnoses     ICD-10-CM ICD-9-CM   1. Well woman exam with routine gynecological exam Z01.419 V72.31   2. Screen for STD (sexually transmitted disease) Z11.3 V74.5   3. Encounter for Papanicolaou smear for cervical cancer screening Z12.4 V76.2   4. Screening for lipid disorders Z13.220 V77.91   5. Class 3 severe obesity due to excess calories without serious comorbidity with body mass index (BMI) of 45.0 to 49.9 in adult (HCC) E66.01 278.01    Z68.42 V85.42       1. Well woman exam  with routine gynecological exam  Patient has famhx od diabetes and obese so she is at higher risk for developing dm. Advised patient will check blood sugar with fasting labs today.   - METABOLIC PANEL, BASIC; Future  - LIPID PANEL; Future    2. Screen for STD (sexually transmitted disease)  - CT/NG/T.VAGINALIS AMPLIFICATION; Future  - HIV 1/2 AG/AB, 4TH GENERATION,W RFLX CONFIRM; Future  - T PALLIDUM SCREEN W/REFLEX; Future  - HBV CORE AB, IGG/IGM; Future    3. Encounter for Papanicolaou smear for cervical cancer screening  - PAP IG, RFX APTIMA HPV ASCUS ([id number])); Future    4. Screening for lipid disorders  - LIPID PANEL; Future    5. Class 3 severe obesity due to excess calories without serious comorbidity with body mass index (BMI) of 45.0 to 49.9 in adult [facility])  I have reviewed/discussed the above normal BMI with the patient.  I have recommended the following interventions: dietary management education, guidance, and counseling, encourage exercise and prescribed dietary intake . Marland Kitchen       Bonne Dolores, MD  11/07/18        No future appointments.

## 2018-11-07 NOTE — Progress Notes (Signed)
Your pap smear did not show any pre-cancerous cells. Repeat is due in 3 years.     Bonne Dolores, MD

## 2018-11-07 NOTE — Progress Notes (Signed)
Identified pt with two pt identifiers(name and DOB). Reviewed record in preparation for visit and have obtained necessary documentation.  Chief Complaint   Patient presents with   . Establish Care   . Physical        Health Maintenance Due   Topic   . HPV Age 9Y-26Y (1 - 2-dose series)   . DTaP/Tdap/Td series (1 - Tdap)   . PAP AKA CERVICAL CYTOLOGY        Coordination of Care Questionnaire:  :   1) Have you been to an emergency room, urgent care, or hospitalized since your last visit?  If yes, where when, and reason for visit? no      2. Have seen or consulted any other health care provider since your last visit?   If yes, where when, and reason for visit?  no        Patient is accompanied by self  I have received verbal consent from Mindy Kirk to discuss any/all medical information while they are present in the room.

## 2018-11-08 LAB — HBV CORE AB, IGG/IGM
Hep B Core Ab, IgM: NEGATIVE
Hep B Core Ab, IgM: NEGATIVE
Hep B Core Ab, total: NEGATIVE
Hep B Core Total Ab: NEGATIVE

## 2018-11-08 LAB — HIV 1/2 ANTIGEN/ANTIBODY, FOURTH GENERATION W/RFL: Interpretation: NONREACTIVE

## 2018-11-08 LAB — HIV 1/2 AG/AB, 4TH GENERATION,W RFLX CONFIRM: HIV 1/2 Interpretation: NONREACTIVE

## 2018-11-08 NOTE — Progress Notes (Signed)
No additional comment

## 2018-11-09 LAB — T PALLIDUM SCREEN W/REFLEX
T PALLIDUM AB: NONREACTIVE
T PALLIDUM AB: NONREACTIVE

## 2018-11-09 NOTE — Progress Notes (Signed)
Your pap smear did not show any pre-cancerous cells. Repeat is due in 3 years.     Catrell Morrone A Myleka Moncure, MD

## 2018-11-10 LAB — CT/NG/T.VAGINALIS AMPLIFICATION
C. trachomatis by NAA: NEGATIVE
CHLAMYDIA BY NAA, 183161: NEGATIVE
GONOCOCCUS BY NAA, 183162: NEGATIVE
N. gonorrhoeae by NAA: NEGATIVE
T. vaginalis by NAA: NEGATIVE
TRICH VAG BY NAA: NEGATIVE

## 2018-12-11 ENCOUNTER — Ambulatory Visit: Attending: Family Medicine | Primary: Family Medicine

## 2018-12-11 ENCOUNTER — Ambulatory Visit: Admit: 2018-12-11 | Payer: BLUE CROSS/BLUE SHIELD | Attending: Family Medicine | Primary: Family Medicine

## 2018-12-11 DIAGNOSIS — R1310 Dysphagia, unspecified: Secondary | ICD-10-CM
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
## 2018-12-11 NOTE — Patient Instructions (Signed)
Swallowing: Exercises  Your Care Instructions  Here are some examples of exercises for you to try. The exercises may be suggested for a condition or for rehabilitation. Start each exercise slowly. Ease off the exercises if you start to have pain. If you need to swallow for an exercise, use saliva, not food or drinks.  You will be told when you can start these exercises and which ones will work best for you. Only do the exercises that your doctor or speech therapist tells you to do. Follow their instructions carefully.  The exercises will help train your muscles and nerves to swallow so your food doesn't go down the wrong way. They can also help you speak better. Your doctor or speech therapist will tell you how often to do the exercises and how many times you should repeat each one.  Shaker exercise     There are two ways to do this exercise. Your doctor or speech therapist may have you do one or both.  1. Lie down on your back on the floor or a firm mattress.  2. Keep your shoulders flat.   Don't lift your shoulders.  3. Bend your head forward so that your chin tucks.   Try to see your toes.  4. Hold the position as your doctor or speech therapist directs.   You can either:  ? Hold this position for 1 minute, and then lower your head and rest for 1 minute.  ? Or hold this position for 1 minute, and then lower your head and go on to the next repetition.  5. Repeat these steps as many times as directed.  Supraglottic exercise  1. Sit or stand comfortably.  2. Take a deep breath and hold it.  3. Swallow while holding your breath.  4. Cough gently.  5. Repeat these steps as many times as directed.  Super supraglottic exercise  1. Sit or stand comfortably.  2. Take a deep breath, and hold it while "bearing down" (like having a bowel movement).  3. Swallow while you hold your breath.  4. Cough gently.  5. Repeat these steps as many times as directed.  Masako maneuver     1. Sit or stand comfortably.   2. Stick your tongue out as far as is comfortable.  3. Hold your tongue gently between your front teeth, and then swallow.  4. Let go of your tongue.  5. Repeat these steps as many times as directed.  Mendelsohn maneuver     1. Sit or stand comfortably.  2. Start to swallow normally.  3. When your Adam's apple is at its highest point, squeeze your throat muscles to hold it in that position for 3 counts, and then relax.   You can use your fingers to help you hold your Adam's apple at its highest point.  4. Repeat these steps as many times as directed.  Effortful swallow exercise  1. Sit or stand comfortably.  2. Squeeze your throat muscles as hard as you can, and then swallow.   Imagine that you have a grape-sized piece of food stuck at the back of your throat, and try to swallow it.  3. Repeat these steps as many times as directed.  Follow-up care is a key part of your treatment and safety. Be sure to make and go to all appointments, and call your doctor if you are having problems. It's also a good idea to know your test results and keep a list of the medicines   you take.  Where can you learn more?  Go to InsuranceStats.cahttp://www.healthwise.net/[facility]  Enter A009 in the search box to learn more about "Swallowing: Exercises."  Current as of: November 27, 2017??????????????????????????????Content Version: 12.5  ?? 2006-2020 [employer].   Care instructions adapted under license by [employer] (which disclaims liability or warranty for this information). If you have questions about a medical condition or this instruction, always ask your healthcare professional. [employer], [employer] disclaims any warranty or liability for your use of this information.         Neck Strain or Sprain: Rehab Exercises  Introduction  Here are some examples of exercises for you to try. The exercises may be suggested for a condition or for rehabilitation. Start each exercise slowly. Ease off the exercises if you start to have pain.   You will be told when to start these exercises and which ones will work best for you.  How to do the exercises  Neck rotation   1. Sit in a firm chair, or stand up straight.  2. Keeping your chin level, turn your head to the right, and hold for 15 to 30 seconds.  3. Turn your head to the left and hold for 15 to 30 seconds.  4. Repeat 2 to 4 times to each side.    Neck stretches   1. Look straight ahead, and tip your right ear to your right shoulder. Do not let your left shoulder rise up as you tip your head to the right.  2. Hold for 15 to 30 seconds.  3. Tilt your head to the left. Do not let your right shoulder rise up as you tip your head to the left.  4. Hold for 15 to 30 seconds.  5. Repeat 2 to 4 times to each side.    Forward neck flexion   1. Sit in a firm chair, or stand up straight.  2. Bend your head forward.  3. Hold for 15 to 30 seconds.  4. Repeat 2 to 4 times.    Lateral (side) bend strengthening   1. With your right hand, place your first two fingers on your right temple.  2. Start to bend your head to the side while using gentle pressure from your fingers to keep your head from bending.  3. Hold for about 6 seconds.  4. Repeat 8 to 12 times.  5. Switch hands and repeat the same exercise on your left side.    Forward bend strengthening   1. Place your first two fingers of either hand on your forehead.  2. Start to bend your head forward while using gentle pressure from your fingers to keep your head from bending.  3. Hold for about 6 seconds.  4. Repeat 8 to 12 times.    Neutral position strengthening   1. Using one hand, place your fingertips on the back of your head at the top of your neck.  2. Start to bend your head backward while using gentle pressure from your fingers to keep your head from bending.  3. Hold for about 6 seconds.  4. Repeat 8 to 12 times.    Chin tuck   1. Lie on the floor with a rolled-up towel under your neck. Your head should be touching the floor.   2. Slowly bring your chin toward your chest.  3. Hold for a count of 6, and then relax for up to 10 seconds.  4. Repeat 8 to 12 times.  Follow-up care is a key part of your treatment and safety. Be sure to make and go to all appointments, and call your doctor if you are having problems. It's also a good idea to know your test results and keep a list of the medicines you take.  Where can you learn more?  Go to [employer]Wrestling.at  Enter M679 in the search box to learn more about "Neck Strain or Sprain: Rehab Exercises."  Current as of: July 02, 2018??????????????????????????????Content Version: 12.5  ?? 2006-2020 [employer].   Care instructions adapted under license by [employer] (which disclaims liability or warranty for this information). If you have questions about a medical condition or this instruction, always ask your healthcare professional. [place] any warranty or liability for your use of this information.         Healthy Upper Back: Exercises  Introduction  Here are some examples of exercises for your upper back. Start each exercise slowly. Ease off the exercise if you start to have pain.  Your doctor or physical therapist will tell you when you can start these exercises and which ones will work best for you.  How to do the exercises  Lower neck and upper back stretch   5. Stretch your arms out in front of your body. Clasp one hand on top of your other hand.  6. Gently reach out so that you feel your shoulder blades stretching away from each other.  7. Gently bend your head forward.  8. Hold for 15 to 30 seconds.  9. Repeat 2 to 4 times.    Midback stretch   If you have knee pain, do not do this exercise.  6. Kneel on the floor, and sit back on your ankles.  7. Lean forward, place your hands on the floor, and stretch your arms out in front of you. Rest your head between your arms.   8. Gently push your chest toward the floor, reaching as far in front of you as possible.  9. Hold for 15 to 30 seconds.  10. Repeat 2 to 4 times.    Shoulder rolls   5. Sit comfortably with your feet shoulder-width apart. You can also do this exercise while standing.  6. Roll your shoulders up, then back, and then down in a smooth, circular motion.  7. Repeat 2 to 4 times.    Wall push-up   6. Stand against a wall with your feet about 12 to 24 inches back from the wall. If you feel any pain when you do this exercise, stand closer to the wall.  7. Place your hands on the wall slightly wider apart than your shoulders, and lean forward.  8. Gently lean your body toward the wall. Then push back to your starting position. Keep the motion smooth and controlled.  9. Repeat 8 to 12 times.    Resisted shoulder blade squeeze   For this exercise, you will need elastic exercise material, such as surgical tubing or Thera-Band.  5. Sit or stand, holding the band in both hands in front of you. Keep your elbows close to your sides, bent at a 90-degree angle. Your palms should face up.  6. Squeeze your shoulder blades together, and move your arms to the outside, stretching the band. Be sure to keep your elbows at your sides while you do this.  7. Relax.  8. Repeat 8 to 12 times.    Resisted rows   For this exercise, you will need  elastic exercise material, such as surgical tubing or Thera-Band.  5. Put the band around a solid object, such as a bedpost, at about waist level. Hold one end of the band in each hand.  6. With your elbows at your sides and bent to 90 degrees, pull the band back to move your shoulder blades toward each other. Return to the starting position.  7. Repeat 8 to 12 times.    Follow-up care is a key part of your treatment and safety. Be sure to make and go to all appointments, and call your doctor if you are having problems. It's also a good idea to know your test results and keep a list  of the medicines you take.  Where can you learn more?  Go to InsuranceStats.cahttp://www.healthwise.net/[facility]Help[employer]ctions  Enter [phone] in the search box to learn more about "Healthy Upper Back: Exercises."  Current as of: July 02, 2018??????????????????????????????Content Version: 12.5  ?? 2006-2020 [employer], [employer].   Care instructions adapted under license by [employer] (which disclaims liability or warranty for this information). If you have questions about a medical condition or this instruction, always ask your healthcare professional. Healthwise, [employer] disclaims any warranty or liability for your use of this information.
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
## 2018-12-19 ENCOUNTER — Inpatient Hospital Stay
Admit: 2018-12-19 | Payer: BLUE CROSS/BLUE SHIELD | Attending: Student in an Organized Health Care Education/Training Program | Primary: Family Medicine

## 2018-12-19 DIAGNOSIS — R1310 Dysphagia, unspecified: Secondary | ICD-10-CM

## 2018-12-19 NOTE — Progress Notes (Signed)
No additional comment

## 2018-12-20 NOTE — Progress Notes (Signed)
No additional comment

## 2018-12-21 ENCOUNTER — Ambulatory Visit
Payer: BLUE CROSS/BLUE SHIELD | Attending: Student in an Organized Health Care Education/Training Program | Primary: Family Medicine

## 2019-01-01 ENCOUNTER — Encounter: Attending: Family Medicine | Primary: Family Medicine

## 2019-02-08 ENCOUNTER — Ambulatory Visit: Attending: Family Medicine | Primary: Family Medicine

## 2019-02-08 ENCOUNTER — Ambulatory Visit: Admit: 2019-02-08 | Payer: BLUE CROSS/BLUE SHIELD | Attending: Family Medicine | Primary: Family Medicine

## 2019-02-08 DIAGNOSIS — M542 Cervicalgia: Secondary | ICD-10-CM

## 2019-02-08 MED ORDER — CYCLOBENZAPRINE 5 MG TAB
5 mg | ORAL_TABLET | Freq: Two times a day (BID) | ORAL | 1 refills | Status: AC | PRN
Start: 2019-02-08 — End: ?
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
## 2019-02-08 NOTE — Patient Instructions (Signed)
Neck Strain or Sprain: Rehab Exercises  Introduction  Here are some examples of exercises for you to try. The exercises may be suggested for a condition or for rehabilitation. Start each exercise slowly. Ease off the exercises if you start to have pain.  You will be told when to start these exercises and which ones will work best for you.  How to do the exercises  Neck rotation   1. Sit in a firm chair, or stand up straight.  2. Keeping your chin level, turn your head to the right, and hold for 15 to 30 seconds.  3. Turn your head to the left and hold for 15 to 30 seconds.  4. Repeat 2 to 4 times to each side.    Neck stretches   1. Look straight ahead, and tip your right ear to your right shoulder. Do not let your left shoulder rise up as you tip your head to the right.  2. Hold for 15 to 30 seconds.  3. Tilt your head to the left. Do not let your right shoulder rise up as you tip your head to the left.  4. Hold for 15 to 30 seconds.  5. Repeat 2 to 4 times to each side.    Forward neck flexion   1. Sit in a firm chair, or stand up straight.  2. Bend your head forward.  3. Hold for 15 to 30 seconds.  4. Repeat 2 to 4 times.    Lateral (side) bend strengthening   1. With your right hand, place your first two fingers on your right temple.  2. Start to bend your head to the side while using gentle pressure from your fingers to keep your head from bending.  3. Hold for about 6 seconds.  4. Repeat 8 to 12 times.  5. Switch hands and repeat the same exercise on your left side.    Forward bend strengthening   1. Place your first two fingers of either hand on your forehead.  2. Start to bend your head forward while using gentle pressure from your fingers to keep your head from bending.  3. Hold for about 6 seconds.  4. Repeat 8 to 12 times.    Neutral position strengthening   1. Using one hand, place your fingertips on the back of your head at the top of your neck.   2. Start to bend your head backward while using gentle pressure from your fingers to keep your head from bending.  3. Hold for about 6 seconds.  4. Repeat 8 to 12 times.    Chin tuck   1. Lie on the floor with a rolled-up towel under your neck. Your head should be touching the floor.  2. Slowly bring your chin toward your chest.  3. Hold for a count of 6, and then relax for up to 10 seconds.  4. Repeat 8 to 12 times.    Follow-up care is a key part of your treatment and safety. Be sure to make and go to all appointments, and call your doctor if you are having problems. It's also a good idea to know your test results and keep a list of the medicines you take.  Where can you learn more?  Go to https://www.healthwise.net/[facility]  Enter M679 in the search box to learn more about "Neck Strain or Sprain: Rehab Exercises."  Current as of: July 02, 2018??????????????????????????????Content Version: 12.6  ?? 2006-2020 Healthwise[employer] Inc[employer].   Care instructions adapted under license   by [employer] (which disclaims liability or warranty for this information). If you have questions about a medical condition or this instruction, always ask your healthcare professional. Healthwise, Incorporated disclaims any warranty or liability for your use of this information.
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
## 2019-02-08 NOTE — Progress Notes (Signed)
 1. Have you been to the ER, urgent care clinic since your last visit?  Hospitalized since your last visit?No    2. Have you seen or consulted any other health care providers outside of the [facility] since your last visit?  Include any pap smears or colon screening. No     Pharmacy verified.   [id number] - [place], [place] - [address] [facility]    Chief Complaint   Patient presents with   . Optician, dispensing   . Back Pain     Patient reports middle back pain usually occurs on and off when taking deep breathe.    . 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 patient states relief only when she lower her head downward. Patient states today no pain but mostly discomfort.      Visit Vitals  BP 103/71 (BP 1 Location: Right arm, BP Patient Position: Sitting)   Pulse 96   Temp 98.3 F (36.8 C) (Oral)   Resp 16   Ht 5' 3 (1.6 m)   Wt 278 lb (126.1 kg)   SpO2 99%   BMI 49.25 kg/m     Patient refuse flu vaccine at this time.     3 most recent PHQ Screens 02/08/2019   Little interest or pleasure in doing things Not at all   Feeling down, depressed, irritable, or hopeless Not at all   Total Score PHQ 2 0
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
## 2019-12-31 ENCOUNTER — Emergency Department
Admission: EM | Admit: 2019-12-31 | Discharge: 2019-12-31 | Disposition: A | Discharge status: Home / Self Care | Payer: Self-pay | Attending: Emergency Medicine | Admitting: Emergency Medicine

## 2019-12-31 ENCOUNTER — Encounter: Payer: Self-pay | Admitting: Emergency Medicine

## 2019-12-31 ENCOUNTER — Other Ambulatory Visit: Payer: Self-pay

## 2019-12-31 ENCOUNTER — Encounter: Payer: Self-pay | Admitting: Physician Assistant

## 2019-12-31 ENCOUNTER — Ambulatory Visit: Payer: Self-pay | Admitting: Physician Assistant

## 2019-12-31 DIAGNOSIS — Z113 Encounter for screening for infections with a predominantly sexual mode of transmission: Secondary | ICD-10-CM

## 2019-12-31 DIAGNOSIS — F17210 Nicotine dependence, cigarettes, uncomplicated: Secondary | ICD-10-CM | POA: Insufficient documentation

## 2019-12-31 DIAGNOSIS — J029 Acute pharyngitis, unspecified: Secondary | ICD-10-CM | POA: Insufficient documentation

## 2019-12-31 LAB — WET PREP FOR TRICH, YEAST, CLUE
Trichomonas Exam: NEGATIVE
Yeast Exam: NEGATIVE

## 2019-12-31 LAB — GROUP A STREP BY PCR: Group A Strep by PCR: NOT DETECTED

## 2019-12-31 MED ORDER — DIPHENHYDRAMINE HCL 12.5 MG/5ML PO ELIX
12.5000 mg | ORAL_SOLUTION | Freq: Once | ORAL | Status: AC
  Administered 2019-12-31: 12.5 mg via ORAL
  Filled 2019-12-31: qty 5

## 2019-12-31 MED ORDER — LIDOCAINE VISCOUS HCL 2 % MT SOLN: 5.0000 mL | Freq: Four times a day (QID) | OROMUCOSAL | 0 refills | Status: DC | PRN

## 2019-12-31 MED ORDER — LIDOCAINE VISCOUS HCL 2 % MT SOLN
15.0000 mL | Freq: Once | OROMUCOSAL | Status: AC
  Administered 2019-12-31: 15 mL via OROMUCOSAL
  Filled 2019-12-31: qty 15

## 2019-12-31 NOTE — Progress Notes (Signed)
  [facility] Department STI clinic/screening visit  Subjective:  Kristie Wolf is a 29 y.o. female being seen today for an STI screening visit. The patient reports they do not have symptoms.  Patient reports that they do not desire a pregnancy in the next year.   They reported they are not interested in discussing contraception today.  Patient's last menstrual period was 12/02/2019 (exact date).   Patient has the following medical conditions:   Patient Active Problem List   Diagnosis Date Noted  . H/O abscess of breast 12/07/2012    Chief Complaint  Patient presents with  . SEXUALLY TRANSMITTED DISEASE    screening    HPI  Patient reports that she is not having any symptoms but would like a screening today.  Reports last HIV test was in 2020 and last pap was about 4 years ago.     See flowsheet for further details and programmatic requirements.    The following portions of the patient's history were reviewed and updated as appropriate: allergies, current medications, past medical history, past social history, past surgical history and problem list.  Objective:  There were no vitals filed for this visit.  Physical Exam Constitutional:      General: She is not in acute distress.    Appearance: Normal appearance.  HENT:     Head: Normocephalic and atraumatic.     Comments: No nits, lice, or hair loss. No cervical, supraclavicular or axillary adenopathy.    Mouth/Throat:     Mouth: Mucous membranes are moist.     Pharynx: Oropharynx is clear. No oropharyngeal exudate or posterior oropharyngeal erythema.  Eyes:     Conjunctiva/sclera: Conjunctivae normal.  Pulmonary:     Effort: Pulmonary effort is normal.  Musculoskeletal:     Cervical back: Neck supple. No tenderness.  Skin:    General: Skin is warm and dry.     Findings: No bruising, erythema, lesion or rash.  Neurological:     Mental Status: She is alert and oriented to person, place, and time.   Psychiatric:        Mood and Affect: Mood normal.        Behavior: Behavior normal.        Thought Content: Thought content normal.        Judgment: Judgment normal.      Assessment and Plan:  Kristie Wolf is a 29 y.o. female presenting to the [facility] Department for STI screening  1. Screening for STD (sexually transmitted disease) Patient into clinic without symptoms. Patient elects to self-collect vaginal samples for GC/Chlamydia and wet mount.  Counseled how to collect samples. Reviewed with patient wet mount results and that no treatment is indicated today. Rec condoms with all sex. Await test results.  Counseled that RN will call if needs to RTC for treatment once results are back. - WET PREP FOR TRICH, YEAST, CLUE - Chlamydia/Gonorrhea [facility] - HIV [facility] - Syphilis Serology, [facility] - Gonococcus culture     No follow-ups on file.  No future appointments.  Matt Holmes, PA

## 2019-12-31 NOTE — Discharge Instructions (Signed)
Your test was negative for strep pharyngitis.  Follow discharge care instruction take medication as directed.

## 2019-12-31 NOTE — ED Notes (Signed)
See triage note   States she developed sore throat 3 days ago   Denies any fever   Having some increased pain with swallowing

## 2019-12-31 NOTE — Progress Notes (Signed)
Pt is here for STD screening. 

## 2019-12-31 NOTE — ED Triage Notes (Signed)
Pt reports sore throat for 3 days. Denies fevers or other sx's.

## 2019-12-31 NOTE — ED Provider Notes (Signed)
[facility] Emergency Department Provider Note   ____________________________________________   First MD Initiated Contact with Patient 12/31/19 1356     (approximate)  I have reviewed the triage vital signs and the nursing notes.   HISTORY  Chief Complaint Sore Throat    HPI Kristie Wolf is a 29 y.o. female patient presents with 3 days of sore throat.  Patient denies URI signs and symptoms.  Patient states she is able to tolerate fluids and soft foods.  Patient rates pain as a 7/10.  Patient described pain as "sore".  No palliative measure for complaint.  Patient has taken the COVID-19 vaccine.           Past Medical History:  Diagnosis Date  . Abscess of breast    LEFT    Patient Active Problem List   Diagnosis Date Noted  . H/O abscess of breast 12/07/2012    Past Surgical History:  Procedure Laterality Date  . INCISION AND DRAINAGE ABSCESS Left 11/20/2012   Procedure: INCISION AND DRAINAGE LEFT BREAST ABSCESS;  Surgeon: Valarie Merino, MD;  Location: [facility];  Service: General;  Laterality: Left;    Prior to Admission medications   Medication Sig Start Date End Date Taking? Authorizing Provider  lidocaine (XYLOCAINE) 2 % solution Use as directed 5 mLs in the mouth or throat every 6 (six) hours as needed for mouth pain. Swish and swallow for sore throat. 12/31/19   Joni Reining, PA-C  norgestimate-ethinyl estradiol (ORTHO-CYCLEN,SPRINTEC,PREVIFEM) 0.25-35 MG-MCG tablet Take 1 tablet by mouth every evening.     [provider]    Allergies Patient has no known allergies.  No family history on file.  Social History Social History   Tobacco Use  . Smoking status: Current Every Day Smoker    Types: Cigarettes  . Smokeless tobacco: Never Used  Substance Use Topics  . Alcohol use: Yes    Comment: occ  . Drug use: Yes    Frequency: 7.0 times per week    Types: Marijuana    Review of Systems Constitutional: No  fever/chills Eyes: No visual changes. ENT: Sore throat. Cardiovascular: Denies chest pain. Respiratory: Denies shortness of breath. Gastrointestinal: No abdominal pain.  No nausea, no vomiting.  No diarrhea.  No constipation. Genitourinary: Negative for dysuria. Musculoskeletal: Negative for back pain. Skin: Negative for rash. Neurological: Negative for headaches, focal weakness or numbness.  ____________________________________________   PHYSICAL EXAM:  VITAL SIGNS: ED Triage Vitals  Enc Vitals Group     BP 12/31/19 1252 140/78     Pulse Rate 12/31/19 1252 80     Resp 12/31/19 1252 18     Temp 12/31/19 1252 98.2 F (36.8 C)     Temp Source 12/31/19 1252 Oral     SpO2 12/31/19 1252 100 %     Weight 12/31/19 1221 250 lb (113.4 kg)     Height 12/31/19 1221 5\' 3"  (1.6 m)     Head Circumference --      Peak Flow --      Pain Score 12/31/19 1221 7     Pain Loc --      Pain Edu? --      Excl. in GC? --    Constitutional: Alert and oriented. Well appearing and in no acute distress. Mouth/Throat: Mucous membranes are moist.  Oropharynx erythematous. Neck: No stridor.  Hematological/Lymphatic/Immunilogical: No cervical lymphadenopathy. Cardiovascular: Normal rate, regular rhythm. Grossly normal heart sounds.  Good peripheral circulation. Respiratory: Normal respiratory effort.  No retractions. Lungs CTAB. Skin:  Skin is warm, dry and intact. No rash noted. Psychiatric: Mood and affect are normal. Speech and behavior are normal.  ____________________________________________   LABS (all labs ordered are listed, but only abnormal results are displayed)  Labs Reviewed  GROUP A STREP BY PCR   ____________________________________________  EKG   ____________________________________________  RADIOLOGY  ED MD interpretation:    Official radiology report(s): No results found.  ____________________________________________   PROCEDURES  Procedure(s) performed  (including Critical Care):  Procedures   ____________________________________________   INITIAL IMPRESSION / ASSESSMENT AND PLAN / ED COURSE  As part of my medical decision making, I reviewed the following data within the electronic MEDICAL RECORD NUMBER     Patient presents with sore throat for 3 days.  Patient denies other URI signs and symptoms.  Patient rapid strep test was negative.  Patient complaint physical exam consistent with viral pharyngitis.  Patient given discharge care instruction advised take medication as directed.    Kristie Wolf was evaluated in Emergency Department on 12/31/2019 for the symptoms described in the history of present illness. She was evaluated in the context of the global COVID-19 pandemic, which necessitated consideration that the patient might be at risk for infection with the SARS-CoV-2 virus that causes COVID-19. Institutional protocols and algorithms that pertain to the evaluation of patients at risk for COVID-19 are in a state of rapid change based on information released by regulatory bodies including the CDC and federal and state organizations. These policies and algorithms were followed during the patient's care in the ED.       ____________________________________________   FINAL CLINICAL IMPRESSION(S) / ED DIAGNOSES  Final diagnoses:  Viral pharyngitis     ED Discharge Orders         Ordered    lidocaine (XYLOCAINE) 2 % solution  Every 6 hours PRN        12/31/19 1404           Note:  This document was prepared using Dragon voice recognition software and may include unintentional dictation errors.    Joni Reining, PA-C 12/31/19 1410    Shaune Pollack, MD 01/07/20 [phone]

## 2020-01-01 ENCOUNTER — Ambulatory Visit: Payer: Self-pay

## 2020-01-05 LAB — GONOCOCCUS CULTURE

## 2020-03-18 ENCOUNTER — Ambulatory Visit: Payer: Medicaid Other

## 2020-04-02 ENCOUNTER — Encounter: Payer: Self-pay | Admitting: Advanced Practice Midwife

## 2020-04-02 ENCOUNTER — Ambulatory Visit: Payer: Self-pay | Admitting: Advanced Practice Midwife

## 2020-04-02 ENCOUNTER — Other Ambulatory Visit: Payer: Self-pay

## 2020-04-02 DIAGNOSIS — Z720 Tobacco use: Secondary | ICD-10-CM

## 2020-04-02 DIAGNOSIS — T7411XS Adult physical abuse, confirmed, sequela: Secondary | ICD-10-CM

## 2020-04-02 DIAGNOSIS — Z113 Encounter for screening for infections with a predominantly sexual mode of transmission: Secondary | ICD-10-CM

## 2020-04-02 DIAGNOSIS — Z62810 Personal history of physical and sexual abuse in childhood: Secondary | ICD-10-CM

## 2020-04-02 DIAGNOSIS — T7411XA Adult physical abuse, confirmed, initial encounter: Secondary | ICD-10-CM | POA: Insufficient documentation

## 2020-04-02 DIAGNOSIS — F17200 Nicotine dependence, unspecified, uncomplicated: Secondary | ICD-10-CM

## 2020-04-02 DIAGNOSIS — F1290 Cannabis use, unspecified, uncomplicated: Secondary | ICD-10-CM | POA: Insufficient documentation

## 2020-04-02 LAB — WET PREP FOR TRICH, YEAST, CLUE
Trichomonas Exam: NEGATIVE
Yeast Exam: NEGATIVE

## 2020-04-02 MED ORDER — METRONIDAZOLE 500 MG PO TABS: 500.0000 mg | ORAL_TABLET | Freq: Two times a day (BID) | ORAL | 0 refills | Status: AC
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
## 2020-04-02 NOTE — Progress Notes (Signed)
Post:  Wet mount reviewed with patient, treated for BV per S.O. Provider orders complete.  Harvie Heck, RN

## 2020-04-03 ENCOUNTER — Ambulatory Visit: Payer: Self-pay

## 2020-04-07 ENCOUNTER — Ambulatory Visit: Payer: Self-pay

## 2020-04-09 LAB — HM HEPATITIS C SCREENING LAB: HM Hepatitis Screen: NEGATIVE

## 2020-04-09 LAB — HM HIV SCREENING LAB: HM HIV Screening: NEGATIVE

## 2020-04-16 ENCOUNTER — Ambulatory Visit: Payer: Medicaid Other

## 2020-04-17 ENCOUNTER — Ambulatory Visit: Payer: Self-pay | Admitting: Advanced Practice Midwife

## 2020-04-17 ENCOUNTER — Other Ambulatory Visit: Payer: Self-pay

## 2020-04-17 ENCOUNTER — Encounter: Payer: Self-pay | Admitting: Advanced Practice Midwife

## 2020-04-17 DIAGNOSIS — Z113 Encounter for screening for infections with a predominantly sexual mode of transmission: Secondary | ICD-10-CM

## 2020-04-17 DIAGNOSIS — B379 Candidiasis, unspecified: Secondary | ICD-10-CM

## 2020-04-17 LAB — WET PREP FOR TRICH, YEAST, CLUE: Trichomonas Exam: NEGATIVE

## 2020-04-17 MED ORDER — CLOTRIMAZOLE 1 % VA CREA: 1.0000 | TOPICAL_CREAM | Freq: Every day | VAGINAL | 0 refills | Status: AC

## 2020-04-17 NOTE — Progress Notes (Signed)
Pt is here for STI screening. Pt reports would like to ask questions about birth control options but doesn't desire to get on birth control today.
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
## 2020-04-17 NOTE — Progress Notes (Signed)
Wet mount reviewed, patient treated for yeast per SO. Patient given PCP list, emergency services card and [facility] card.Burt Knack, RN

## 2020-06-03 ENCOUNTER — Ambulatory Visit: Payer: Medicaid Other | Admitting: Licensed Clinical Social Worker

## 2020-06-11 ENCOUNTER — Other Ambulatory Visit: Payer: Self-pay

## 2020-06-11 ENCOUNTER — Encounter: Payer: Self-pay | Admitting: Licensed Clinical Social Worker

## 2020-06-11 ENCOUNTER — Ambulatory Visit: Payer: Medicaid Other | Admitting: Licensed Clinical Social Worker

## 2020-06-11 DIAGNOSIS — F3289 Other specified depressive episodes: Secondary | ICD-10-CM
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
## 2020-06-18 ENCOUNTER — Encounter: Payer: Self-pay | Admitting: Licensed Clinical Social Worker

## 2020-06-18 ENCOUNTER — Ambulatory Visit: Payer: Medicaid Other | Admitting: Licensed Clinical Social Worker

## 2020-06-18 ENCOUNTER — Other Ambulatory Visit: Payer: Self-pay

## 2020-06-18 DIAGNOSIS — F3289 Other specified depressive episodes: Secondary | ICD-10-CM

## 2020-06-18 NOTE — Progress Notes (Signed)
Counselor/Therapist Progress Note  Patient ID: Kristie Wolf, MRN: [id number],    Date: 06/18/2020  Time Spent: 43 minutes    Treatment Type: Psychotherapy  Reported Symptoms: Feelings of Worthlessness, Hopelessness, Obsessive thinking, Anhedonia and low mood  Mental Status Exam:  Appearance:   Casual, Neat and Well Groomed     Behavior:  Appropriate and Sharing  Motor:  Normal  Speech/Language:   Normal Rate  Affect:  Appropriate, Congruent, Full Range and Tearful  Mood:  normal  Thought process:  normal  Thought content:    WNL  Sensory/Perceptual disturbances:    WNL  Orientation:  oriented to person, place, time/date and situation  Attention:  Good  Concentration:  Good  Memory:  WNL  Fund of knowledge:   Good  Insight:    Fair  Judgment:   Fair  Impulse Control:  Fair   Risk Assessment: Danger to Self:  No Self-injurious Behavior: No Danger to Others: No Duty to Warn:no Physical Aggression / Violence:No  Access to Firearms a concern: No  Gang Involvement:No   Subjective: Patient was engaged and cooperative throughout the session using time effectively to discuss thoughts,  feelings and treatment planning.  Patient voices continued motivation for treatment and understanding of depression issues. Patient is likely to benefit from future treatment because she is  motivated to decrease symptoms and reports benefit of sessions.     Interventions: Cognitive Behavioral Therapy and Mindfulness Meditation  Checked in with patient and reviewed previous session, including assessment and goal of treatment. Reviewed CBTs. Explored patient's goal of treatment and worked collaboratively to develop CBT treatment plan. Encouraged patient to notice thoughts without judgement or attachment as homework task. Provided support through active listening, validation of feelings, and highlighted patient's strengths.   Diagnosis:   ICD-10-CM   1. Other depression  F32.89     Plan:  Check in on homework of noticing thoughts without judgement or attachment   Patient's goal is to have more positive self talk.    Treatment Target: Understand the relationship between thoughts, emotions, and behaviors  - Psychoeducation on CBT model   - Teach the connection between thoughts, emotions, and behaviors  - Enhance emotional awareness and discrimination of emotions  - Understand thoughts, emotions, and behaviors   Treatment Target: Increase realistic balanced thinking  - Explore patient's thoughts, beliefs, automatic thoughts, assumptions  - Identify hot thoughts (upsetting ideas, self-talk and mental images) - Identify and replace thinking that leads to depression - Process distress/trauma, identify "stuck" points and allow for emotional release  - Challenging the evidence  - Evaluate thoughts - Cognitive reappraisal  - Provided psychoeducation on core beliefs, explore, and assist patient in identifying core beliefs and Modify underlying beliefs   Treatment Target: Reducing vulnerability to "emotional mind" - Values and goal clarification   - Self-care - nutrition, sleep, exercise   Treatment Target: Increase coping skills - Mindfulness practices  - Teach mindfulness-  observing thought, letting go, focus on awareness of thoughts and feelings without attachment or judgment - Grounding techniques as necessary  - STOP technique  Future Appointments  Date Time Provider Department Center  06/24/2020  8:00 AM Kathreen Cosier, LCSW [facility] None    Kathreen Cosier, LCSW

## 2020-06-24 ENCOUNTER — Ambulatory Visit: Payer: Medicaid Other | Admitting: Licensed Clinical Social Worker

## 2020-06-29 ENCOUNTER — Telehealth: Payer: Self-pay | Admitting: Licensed Clinical Social Worker

## 2020-06-29 NOTE — Telephone Encounter (Signed)
Pt. Msg. Regarding rescheduling appointment.

## 2021-04-19 ENCOUNTER — Other Ambulatory Visit: Payer: Self-pay | Admitting: Family Medicine

## 2021-04-19 DIAGNOSIS — R1311 Dysphagia, oral phase: Secondary | ICD-10-CM

## 2021-04-30 ENCOUNTER — Ambulatory Visit
Admission: RE | Admit: 2021-04-30 | Discharge: 2021-04-30 | Disposition: A | Discharge status: Home / Self Care | Payer: Medicaid Other | Source: Ambulatory Visit | Attending: Family Medicine | Admitting: Family Medicine

## 2021-04-30 ENCOUNTER — Other Ambulatory Visit: Payer: Self-pay

## 2021-04-30 DIAGNOSIS — R1311 Dysphagia, oral phase: Secondary | ICD-10-CM

## 2021-05-24 ENCOUNTER — Other Ambulatory Visit: Payer: Self-pay

## 2021-05-24 ENCOUNTER — Emergency Department (HOSPITAL_COMMUNITY)
Admission: EM | Admit: 2021-05-24 | Discharge: 2021-05-24 | Disposition: A | Discharge status: Home / Self Care | Payer: Medicaid Other | Attending: Student | Admitting: Student

## 2021-05-24 DIAGNOSIS — F17290 Nicotine dependence, other tobacco product, uncomplicated: Secondary | ICD-10-CM | POA: Insufficient documentation

## 2021-05-24 DIAGNOSIS — R222 Localized swelling, mass and lump, trunk: Secondary | ICD-10-CM | POA: Insufficient documentation

## 2021-05-24 DIAGNOSIS — R229 Localized swelling, mass and lump, unspecified: Secondary | ICD-10-CM

## 2021-05-24 MED ORDER — CEFADROXIL 500 MG PO CAPS: 500.0000 mg | ORAL_CAPSULE | Freq: Two times a day (BID) | ORAL | 0 refills | Status: AC

## 2021-05-24 MED ORDER — CEFADROXIL 500 MG PO CAPS: 500.0000 mg | ORAL_CAPSULE | Freq: Two times a day (BID) | ORAL | 0 refills | Status: DC

## 2021-05-24 MED ORDER — LIDOCAINE HCL (PF) 1 % IJ SOLN: 5.0000 mL | Freq: Once | INTRAMUSCULAR | Status: DC

## 2021-05-24 NOTE — ED Provider Triage Note (Signed)
Emergency Medicine Provider Triage Evaluation Note  Rachel Rison , a 31 y.o. female  was evaluated in triage.  Pt complains of abscess to the buttocks.  Reports that this has been there for a year.  No history of diabetes or PCOS.  Reports she has tried to pop it however it is not helping.  Review of Systems  As above  Physical Exam  BP 130/79 (BP Location: Right Arm)    Pulse 75    Temp 98.4 F (36.9 C) (Oral)    Resp 18    LMP 04/25/2021 (Exact Date)    SpO2 99%  Gen:   Awake, no distress   Resp:  Normal effort  MSK:   Moves extremities without difficulty  Other:  2 small cysts to the right buttocks near the midline.  No erythema, oozing or fluctuance.  Medical Decision Making  Medically screening exam initiated at 3:43 PM.  Appropriate orders placed.  Ginnie Marich was informed that the remainder of the evaluation will be completed by another provider, this initial triage assessment does not replace that evaluation, and the importance of remaining in the ED until their evaluation is complete.   Do not believe patient needs an I&D.   Saddie Benders, PA-C 05/24/21 1545

## 2021-05-24 NOTE — Discharge Instructions (Addendum)
You were seen in the emergency department for evaluation of skin nodules on your buttocks.  At this time they have not developed into abscesses and we will attempt to treat these with antibiotics these do not try and open these on your own.  Please use her bathtub at home to perform a sitz bath daily.  Please set up a primary care follow-up next week to ensure proper resolution of these nodules.  At this time you are safe for discharge but return to the emergency department if you notice new worsening pain or swelling, fever or any other concerning symptoms.

## 2021-05-24 NOTE — ED Triage Notes (Signed)
Pt from home for eval of presumed abscess to R buttocks x 1 year. However over the last few days it has gotten significantly bigger and more painful. Is now approximately 4-6 inches in length. Did pop it with a skin care kit last week and blood and puss came out but it has not continued to drain.
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
## 2021-05-24 NOTE — ED Notes (Signed)
X1 for vitals with no response 

## 2021-08-26 ENCOUNTER — Ambulatory Visit: Payer: Medicaid Other

## 2021-09-01 ENCOUNTER — Encounter: Payer: Self-pay | Admitting: Nurse Practitioner

## 2021-09-01 ENCOUNTER — Ambulatory Visit: Payer: Self-pay | Admitting: Nurse Practitioner

## 2021-09-01 DIAGNOSIS — Z113 Encounter for screening for infections with a predominantly sexual mode of transmission: Secondary | ICD-10-CM

## 2021-09-01 LAB — HEPATITIS B SURFACE ANTIGEN: Hepatitis B Surface Ag: NONREACTIVE

## 2021-09-01 LAB — HM HEPATITIS C SCREENING LAB: HM Hepatitis Screen: NEGATIVE

## 2021-09-01 LAB — HM HIV SCREENING LAB: HM HIV Screening: NEGATIVE

## 2021-09-01 NOTE — Progress Notes (Signed)
Patient is here for STD screening. Wet prep reviewed, no tx per standing orders. Condoms given.  ?

## 2021-09-02 LAB — WET PREP FOR TRICH, YEAST, CLUE
Trichomonas Exam: NEGATIVE
Yeast Exam: NEGATIVE
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
## 2021-09-14 ENCOUNTER — Telehealth: Payer: Self-pay

## 2021-09-14 NOTE — Telephone Encounter (Signed)
Calling pt regarding positive chlamydia result from 09/01/21 vaginal specimen. ?Pt needs tx appt. ? ?Phone call to pt at [phone]. Left message on voicemail that RN with [facility] is calling regarding TR.  Please call Gianina Olinde at [phone]. ? ?Also send MyChart message. ?

## 2021-09-14 NOTE — Telephone Encounter (Addendum)
Pt returned call and provided password. Counseled pt regarding positive CT result and need for tx.   ?Pt states: ?NKA ?Not using BC ?Pt counseled to eat before coming for tx. ? ?Pt states she recently started a new job and has training in [place] for about 10 weeks. Offered pt overbook appt for this week. Pt accepted late tx appt on 09/20/21.  Pt states she may try to get an earlier appt with [facility] or [facility] since it is closer to where she training for now.  She will cancel appt at [facility] for 09/20/21 if she is able to get treated before then. ? ?Shared [place] EDDS event with [facility] and [place] just in case she is seen by one of these Health Departments. ?

## 2021-09-22 NOTE — Telephone Encounter (Signed)
Sent MyChart message to follow-up about pt getting treated.  Not seen at [facility] for tx at this time.

## 2021-09-23 NOTE — Telephone Encounter (Signed)
Phone call to pt at [phone]. Left message on voicemail that RN with [facility] is calling about tx. Please give me call back to let me know if you received tx and where it was provided, and if not treated would be glad to get her scheduled at [facility].  Please call Calissa Swenor at [phone].

## 2021-09-28 NOTE — Telephone Encounter (Addendum)
Received phone message/voicemail from pt 09/27/21, returned phone call and requested call back.  Called pt back 09/28/21.  Pt states she went to [facility]. Pt states she received Doxycline 100 mg BID x 7 days. Counseled about TOC in approx 3 months.

## 2021-09-29 NOTE — Telephone Encounter (Signed)
Received call back from [facility].  They confirmed tx completed and added info to [place] EDDs.

## 2021-10-01 ENCOUNTER — Telehealth: Payer: Self-pay

## 2021-10-01 NOTE — Telephone Encounter (Signed)
Phone call received from pt.  Pt states she discovered her partner was not taking medications as prescribed; she found partner's doxycycline while doing laundry.   Further states both her and partner were treated for trich and chlamydia in [facility].  She took meds at same time and completed them. She discovered the partner took one medication and finished it and then started taking the second medication (Doxy).  Now she is unsure if her partner really took the trich medication as prescribed.  It also appears that her partner is not taking Doxycycline as prescribed based on the time frame involved.  They have had sex; she thought both of them had been treated properly for trich and chlamydia.  Pt needs retreatment per standing order for chlamydia due to sex with untreated partner.    Pt states she also wants to be retreated for trich because she is not sure if her partner took medicine correctly.  Tx appt scheduled 10/06/21.  Pt states she will see if she can get an earlier appt with [facility] again due to her continued work training in that area. She may call to cancel 10/06/21 tx with [facility] if she gets appt elsewhere.

## 2021-10-06 ENCOUNTER — Ambulatory Visit: Payer: Self-pay

## 2021-10-06 DIAGNOSIS — A599 Trichomoniasis, unspecified: Secondary | ICD-10-CM

## 2021-10-06 DIAGNOSIS — A749 Chlamydial infection, unspecified: Secondary | ICD-10-CM

## 2021-10-06 MED ORDER — AZITHROMYCIN 500 MG PO TABS
1000.0000 mg | ORAL_TABLET | Freq: Once | ORAL | Status: AC
  Administered 2021-10-06: 1000 mg via ORAL

## 2021-10-06 MED ORDER — METRONIDAZOLE 500 MG PO TABS: 500.0000 mg | ORAL_TABLET | Freq: Two times a day (BID) | ORAL | 0 refills | Status: DC

## 2021-10-06 NOTE — Telephone Encounter (Signed)
Pt to clinic 10/06/21 for retx.  TR from [place] not provided by pt.  Call [place] and request trich TR from May 2023.

## 2021-10-06 NOTE — Progress Notes (Signed)
In Nurse Clinic for re treatment of chlamydia and trich. Pt explains she and her partner were tx for chlamydia and trich at [facility] Dept approx 09/15/2021(unsure of exact date.) Pt states she completed the medicine as prescribed.   Pt had positive chlamydia lab result from [facility] on 09/01/2021. Trich on 09/01/2021 was negative.   Pt says she had positive trich test result when she went to [facility] 09/15/2021(approx date) and she was given Metronidazole.  Pt says BF did not finish  meds as prescribed for trich and chlamydia and they had unprotected sex one week ago (approx 09/29/2021). Pt requests re treatment today. On no birth control method. LMP 09/06/2021.  Re treated for chlamydia per [facility] Dr Karyl Kinnier with Azithromycin 1 gram po DOT once.   Consult A White, FNP who gives ok to re treat Trich with Metronidazole per [facility] Dr Karyl Kinnier. Provider asks that Exie Parody, RN contact [facility] so they can send copy of trich results to [facility].     RN dispensed Metronidazole 500 mg #14. Instructions reviewed. Trich info sheet with Metronidazole instructions given and reviewed. Advised to avoid alcohol 24 hrs before, during tx, and 3 days after completing Metronidazole. Advised to contact [facility] if vomits within 2 hrs of taking Azithromycin or Metronidazole. Questions answered and reports understanding. Jerel Shepherd, RN

## 2021-10-13 NOTE — Telephone Encounter (Signed)
Phone call to [facility].  Left message for CD Coordinator requesting copy of trich TR be faxed to [facility] CD fax.  Call back info and fax numbers provided.

## 2021-12-10 LAB — OB RESULTS CONSOLE HEPATITIS B SURFACE ANTIGEN: Hepatitis B Surface Ag: NEGATIVE

## 2021-12-10 LAB — OB RESULTS CONSOLE ABO/RH: RH Type: POSITIVE

## 2021-12-10 LAB — HEPATITIS C ANTIBODY: HCV Ab: NEGATIVE

## 2021-12-10 LAB — OB RESULTS CONSOLE RPR: RPR: NONREACTIVE

## 2021-12-10 LAB — OB RESULTS CONSOLE HIV ANTIBODY (ROUTINE TESTING): HIV: NONREACTIVE

## 2021-12-10 LAB — OB RESULTS CONSOLE ANTIBODY SCREEN: Antibody Screen: NEGATIVE

## 2021-12-10 LAB — OB RESULTS CONSOLE RUBELLA ANTIBODY, IGM: Rubella: IMMUNE

## 2021-12-16 LAB — OB RESULTS CONSOLE GC/CHLAMYDIA
Chlamydia: NEGATIVE
Neisseria Gonorrhea: NEGATIVE

## 2022-01-25 ENCOUNTER — Other Ambulatory Visit: Payer: Self-pay

## 2022-01-25 ENCOUNTER — Other Ambulatory Visit: Payer: Self-pay | Admitting: Obstetrics and Gynecology

## 2022-01-25 DIAGNOSIS — Z363 Encounter for antenatal screening for malformations: Secondary | ICD-10-CM

## 2022-01-28 ENCOUNTER — Ambulatory Visit: Payer: Medicaid Other | Attending: Obstetrics and Gynecology

## 2022-01-28 ENCOUNTER — Ambulatory Visit: Payer: Medicaid Other | Admitting: *Deleted

## 2022-01-28 VITALS — BP 120/72 | HR 77

## 2022-01-28 DIAGNOSIS — O99212 Obesity complicating pregnancy, second trimester: Secondary | ICD-10-CM | POA: Insufficient documentation

## 2022-01-28 DIAGNOSIS — Z363 Encounter for antenatal screening for malformations: Secondary | ICD-10-CM

## 2022-01-31 ENCOUNTER — Other Ambulatory Visit: Payer: Self-pay | Admitting: *Deleted

## 2022-01-31 DIAGNOSIS — O99212 Obesity complicating pregnancy, second trimester: Secondary | ICD-10-CM

## 2022-01-31 DIAGNOSIS — Z362 Encounter for other antenatal screening follow-up: Secondary | ICD-10-CM

## 2022-01-31 DIAGNOSIS — O280 Abnormal hematological finding on antenatal screening of mother: Secondary | ICD-10-CM

## 2022-02-25 ENCOUNTER — Ambulatory Visit: Payer: Medicaid Other | Attending: Obstetrics

## 2022-02-25 ENCOUNTER — Ambulatory Visit: Payer: Medicaid Other | Admitting: *Deleted

## 2022-02-25 ENCOUNTER — Other Ambulatory Visit: Payer: Self-pay | Admitting: *Deleted

## 2022-02-25 VITALS — BP 135/77 | HR 79

## 2022-02-25 DIAGNOSIS — O99212 Obesity complicating pregnancy, second trimester: Secondary | ICD-10-CM | POA: Diagnosis present

## 2022-02-25 DIAGNOSIS — O280 Abnormal hematological finding on antenatal screening of mother: Secondary | ICD-10-CM

## 2022-02-25 DIAGNOSIS — Z362 Encounter for other antenatal screening follow-up: Secondary | ICD-10-CM | POA: Diagnosis present

## 2022-02-25 DIAGNOSIS — F1210 Cannabis abuse, uncomplicated: Secondary | ICD-10-CM

## 2022-04-01 ENCOUNTER — Other Ambulatory Visit: Payer: Self-pay | Admitting: *Deleted

## 2022-04-01 ENCOUNTER — Ambulatory Visit: Payer: Medicaid Other | Admitting: *Deleted

## 2022-04-01 ENCOUNTER — Ambulatory Visit: Payer: Medicaid Other | Attending: Maternal & Fetal Medicine

## 2022-04-01 VITALS — BP 114/69 | HR 75

## 2022-04-01 DIAGNOSIS — R638 Other symptoms and signs concerning food and fluid intake: Secondary | ICD-10-CM

## 2022-04-01 DIAGNOSIS — F1210 Cannabis abuse, uncomplicated: Secondary | ICD-10-CM | POA: Diagnosis present

## 2022-04-01 DIAGNOSIS — O99213 Obesity complicating pregnancy, third trimester: Secondary | ICD-10-CM

## 2022-04-01 DIAGNOSIS — E669 Obesity, unspecified: Secondary | ICD-10-CM | POA: Diagnosis not present

## 2022-04-01 DIAGNOSIS — O99212 Obesity complicating pregnancy, second trimester: Secondary | ICD-10-CM | POA: Diagnosis not present

## 2022-04-01 DIAGNOSIS — Z362 Encounter for other antenatal screening follow-up: Secondary | ICD-10-CM | POA: Diagnosis not present

## 2022-04-01 DIAGNOSIS — Z3A28 28 weeks gestation of pregnancy: Secondary | ICD-10-CM | POA: Diagnosis not present

## 2022-04-29 ENCOUNTER — Ambulatory Visit: Payer: Medicaid Other | Admitting: *Deleted

## 2022-04-29 ENCOUNTER — Ambulatory Visit: Payer: Medicaid Other | Attending: Maternal & Fetal Medicine

## 2022-04-29 DIAGNOSIS — E669 Obesity, unspecified: Secondary | ICD-10-CM | POA: Diagnosis not present

## 2022-04-29 DIAGNOSIS — O99213 Obesity complicating pregnancy, third trimester: Secondary | ICD-10-CM | POA: Diagnosis present

## 2022-04-29 DIAGNOSIS — O99323 Drug use complicating pregnancy, third trimester: Secondary | ICD-10-CM | POA: Diagnosis not present

## 2022-04-29 DIAGNOSIS — R638 Other symptoms and signs concerning food and fluid intake: Secondary | ICD-10-CM | POA: Insufficient documentation

## 2022-04-29 DIAGNOSIS — Z3A32 32 weeks gestation of pregnancy: Secondary | ICD-10-CM

## 2022-04-29 DIAGNOSIS — F1290 Cannabis use, unspecified, uncomplicated: Secondary | ICD-10-CM | POA: Diagnosis not present

## 2022-04-30 ENCOUNTER — Encounter (HOSPITAL_COMMUNITY): Payer: Self-pay | Admitting: Obstetrics & Gynecology

## 2022-04-30 ENCOUNTER — Inpatient Hospital Stay (HOSPITAL_COMMUNITY)
Admission: AD | Admit: 2022-04-30 | Discharge: 2022-04-30 | Disposition: A | Discharge status: Home / Self Care | Payer: Medicaid Other | Attending: Obstetrics & Gynecology | Admitting: Obstetrics & Gynecology

## 2022-04-30 DIAGNOSIS — Z3493 Encounter for supervision of normal pregnancy, unspecified, third trimester: Secondary | ICD-10-CM

## 2022-04-30 DIAGNOSIS — Z3A32 32 weeks gestation of pregnancy: Secondary | ICD-10-CM | POA: Insufficient documentation

## 2022-04-30 DIAGNOSIS — O368130 Decreased fetal movements, third trimester, not applicable or unspecified: Secondary | ICD-10-CM | POA: Insufficient documentation

## 2022-04-30 DIAGNOSIS — F17290 Nicotine dependence, other tobacco product, uncomplicated: Secondary | ICD-10-CM | POA: Insufficient documentation

## 2022-04-30 DIAGNOSIS — O99213 Obesity complicating pregnancy, third trimester: Secondary | ICD-10-CM | POA: Diagnosis not present

## 2022-04-30 DIAGNOSIS — Z3689 Encounter for other specified antenatal screening: Secondary | ICD-10-CM | POA: Diagnosis not present

## 2022-04-30 NOTE — MAU Note (Addendum)
...  Kristie Wolf is a 31 y.o. at [redacted]w[redacted]d here in MAU reporting: DFM this morning since waking up around 0700. She reports she is feeling movement but reports it is less. She reports she ate breakfast but her baby has not increased his movements. She reports since yesterday evening whenever she wipes her bottom after using the restroom she experiences a sharp upper abdominal pain with turning. She reports she doordashed yesterday evening for around four hours and the pain started after that. Denies VB or LOF.  Patient reports she had a MFM visit yesterday and she was told that her baby has an irregular heart beat and that she would need to be seen every week through the rest of her pregnancy. She reports she informed them that she had an appointment scheduled for four weeks from now and they told her to keep that appointment instead. She reports she is worried because she is not understanding the irregular heart beat diagnosis and is worried for her baby.  Fetal movement clicker given.  Onset of complaint:  Pain score: 5/10 upper abdomen  FHT: 145 initial external

## 2022-04-30 NOTE — Discharge Instructions (Signed)

## 2022-04-30 NOTE — MAU Provider Note (Signed)
History     CSN: [id number]  Arrival date and time: 04/30/22 4098   Event Date/Time   First Provider Initiated Contact with Patient 04/30/22 1026      Chief Complaint  Patient presents with   Decreased Fetal Movement   HPI  Kristie Wolf is a 31 y.o. G1P0 at [redacted]w[redacted]d who presents for evaluation of decreased fetal movement. Patient reports she has not felt the baby move normally today. She states she usually feels lots of movement first thing in the morning and that did not happen today. She states she was at MFM yesterday who told her the baby has an irregular heart beat. She states she doesn't know what that means and it has made her very nervous. She reports intermittent braxton hicks but not feeling any at this time.    She denies any vaginal bleeding, discharge, and leaking of fluid. Denies any constipation, diarrhea or any urinary complaints. Reports normal fetal movement.   OB History     Gravida  1   Para      Term      Preterm      AB      Living         SAB      IAB      Ectopic      Multiple      Live Births              Past Medical History:  Diagnosis Date   Abscess of breast    LEFT   Obesity    Vitamin D deficiency     Past Surgical History:  Procedure Laterality Date   INCISION AND DRAINAGE ABSCESS Left 11/20/2012   Procedure: INCISION AND DRAINAGE LEFT BREAST ABSCESS;  Surgeon: Valarie Merino, MD;  Location: [facility];  Service: General;  Laterality: Left;   WISDOM TOOTH EXTRACTION      Family History  Problem Relation Age of Onset   Hypertension Father    Diabetes Father    Cancer Paternal Grandfather    Asthma Neg Hx    Heart disease Neg Hx    Stroke Neg Hx     Social History   Tobacco Use   Smoking status: Former    Types: Cigarettes, E-cigarettes    Quit date: 01/05/2021    Years since quitting: 1.3   Smokeless tobacco: Never  Vaping Use   Vaping Use: Former   Quit date: 07/07/2021   Substances: Nicotine, Flavoring   Substance Use Topics   Alcohol use: Not Currently    Alcohol/week: 1.0 standard drink of alcohol    Types: 1 Standard drinks or equivalent per week    Comment: occassionally   Drug use: Not Currently    Frequency: 7.0 times per week    Types: Marijuana    Comment: last use weed early sep 2023    Allergies: No Known Allergies  No medications prior to admission.    Review of Systems  Constitutional: Negative.  Negative for fatigue and fever.  HENT: Negative.    Respiratory: Negative.  Negative for shortness of breath.   Cardiovascular: Negative.  Negative for chest pain.  Gastrointestinal: Negative.  Negative for abdominal pain, constipation, diarrhea, nausea and vomiting.  Genitourinary: Negative.  Negative for dysuria, vaginal bleeding and vaginal discharge.  Neurological: Negative.  Negative for dizziness and headaches.   Physical Exam   Blood pressure 122/81, pulse 88, temperature 98.3 F (36.8 C), temperature source Oral, resp. rate 19, height 5'  3" (1.6 m), weight 124.5 kg, last menstrual period 09/06/2021, SpO2 100 %.  Patient Vitals for the past 24 hrs:  BP Temp Temp src Pulse Resp SpO2 Height Weight  04/30/22 1052 122/81 -- -- 88 -- 100 % -- --  04/30/22 1023 128/80 98.3 F (36.8 C) Oral 95 19 100 % 5\' 3"  (1.6 m) 124.5 kg    Physical Exam Vitals and nursing note reviewed.  Constitutional:      General: She is not in acute distress.    Appearance: She is well-developed.  HENT:     Head: Normocephalic.  Eyes:     Pupils: Pupils are equal, round, and reactive to light.  Cardiovascular:     Rate and Rhythm: Normal rate and regular rhythm.     Heart sounds: Normal heart sounds.  Pulmonary:     Effort: Pulmonary effort is normal. No respiratory distress.     Breath sounds: Normal breath sounds.  Abdominal:     General: Bowel sounds are normal. There is no distension.     Palpations: Abdomen is soft.     Tenderness: There is no abdominal tenderness.  Skin:     General: Skin is warm and dry.  Neurological:     Mental Status: She is alert and oriented to person, place, and time.  Psychiatric:        Mood and Affect: Mood normal.        Behavior: Behavior normal.        Thought Content: Thought content normal.        Judgment: Judgment normal.     Fetal Tracing:  Baseline: 130 Variability: moderate Accels: 15x15 Decels: none  Toco: occasional uc's with UI, patient not feeling  MAU Course  Procedures  MDM Labs ordered and reviewed.   NST reactive Patient feeling normal fetal movement in MAU  Lengthy discussion with patient regarding fetal PACs and implications in pregnancy. Reassurance provided of fetal well being at this time.   Assessment and Plan   1. Movement of fetus present during pregnancy in third trimester   2. [redacted] weeks gestation of pregnancy   3. NST (non-stress test) reactive     -Discharge home in stable condition -Third trimester precautions discussed -Patient advised to follow-up with OB as scheduled for prenatal care on Thursday. -Patient may return to MAU as needed or if her condition were to change or worsen  Wende Mott, CNM 04/30/2022, 10:26 AM

## 2022-05-27 ENCOUNTER — Ambulatory Visit: Payer: Medicaid Other | Admitting: *Deleted

## 2022-05-27 ENCOUNTER — Ambulatory Visit: Payer: Medicaid Other | Attending: Maternal & Fetal Medicine

## 2022-05-27 VITALS — BP 118/67 | HR 97

## 2022-05-27 DIAGNOSIS — O99213 Obesity complicating pregnancy, third trimester: Secondary | ICD-10-CM | POA: Diagnosis present

## 2022-05-27 DIAGNOSIS — F1290 Cannabis use, unspecified, uncomplicated: Secondary | ICD-10-CM

## 2022-05-27 DIAGNOSIS — R638 Other symptoms and signs concerning food and fluid intake: Secondary | ICD-10-CM | POA: Diagnosis present

## 2022-05-27 DIAGNOSIS — Z3A36 36 weeks gestation of pregnancy: Secondary | ICD-10-CM

## 2022-05-27 DIAGNOSIS — O99323 Drug use complicating pregnancy, third trimester: Secondary | ICD-10-CM

## 2022-05-27 DIAGNOSIS — O368390 Maternal care for abnormalities of the fetal heart rate or rhythm, unspecified trimester, not applicable or unspecified: Secondary | ICD-10-CM | POA: Diagnosis present

## 2022-05-27 DIAGNOSIS — E669 Obesity, unspecified: Secondary | ICD-10-CM

## 2022-05-27 LAB — OB RESULTS CONSOLE GBS: GBS: POSITIVE

## 2022-06-03 ENCOUNTER — Ambulatory Visit: Payer: Medicaid Other | Attending: Maternal & Fetal Medicine

## 2022-06-03 ENCOUNTER — Other Ambulatory Visit: Payer: Self-pay | Admitting: *Deleted

## 2022-06-03 ENCOUNTER — Ambulatory Visit: Payer: Medicaid Other | Admitting: *Deleted

## 2022-06-03 VITALS — BP 109/70 | HR 80

## 2022-06-03 DIAGNOSIS — O99213 Obesity complicating pregnancy, third trimester: Secondary | ICD-10-CM

## 2022-06-03 DIAGNOSIS — R638 Other symptoms and signs concerning food and fluid intake: Secondary | ICD-10-CM | POA: Insufficient documentation

## 2022-06-03 DIAGNOSIS — Z3A37 37 weeks gestation of pregnancy: Secondary | ICD-10-CM | POA: Diagnosis not present

## 2022-06-03 DIAGNOSIS — E669 Obesity, unspecified: Secondary | ICD-10-CM | POA: Diagnosis not present

## 2022-06-05 ENCOUNTER — Encounter (HOSPITAL_COMMUNITY): Payer: Self-pay | Admitting: Obstetrics & Gynecology

## 2022-06-05 ENCOUNTER — Inpatient Hospital Stay (HOSPITAL_COMMUNITY)
Admission: AD | Admit: 2022-06-05 | Discharge: 2022-06-05 | Disposition: A | Discharge status: Home / Self Care | Payer: Medicaid Other | Attending: Obstetrics & Gynecology | Admitting: Obstetrics & Gynecology

## 2022-06-05 ENCOUNTER — Other Ambulatory Visit: Payer: Self-pay

## 2022-06-05 DIAGNOSIS — O23593 Infection of other part of genital tract in pregnancy, third trimester: Secondary | ICD-10-CM | POA: Diagnosis present

## 2022-06-05 DIAGNOSIS — O98819 Other maternal infectious and parasitic diseases complicating pregnancy, unspecified trimester: Secondary | ICD-10-CM | POA: Diagnosis not present

## 2022-06-05 DIAGNOSIS — B3731 Acute candidiasis of vulva and vagina: Secondary | ICD-10-CM | POA: Diagnosis not present

## 2022-06-05 DIAGNOSIS — O98813 Other maternal infectious and parasitic diseases complicating pregnancy, third trimester: Secondary | ICD-10-CM | POA: Insufficient documentation

## 2022-06-05 DIAGNOSIS — B9689 Other specified bacterial agents as the cause of diseases classified elsewhere: Secondary | ICD-10-CM | POA: Diagnosis not present

## 2022-06-05 DIAGNOSIS — Z3A37 37 weeks gestation of pregnancy: Secondary | ICD-10-CM | POA: Insufficient documentation

## 2022-06-05 LAB — WET PREP, GENITAL
Sperm: NONE SEEN
Trich, Wet Prep: NONE SEEN
WBC, Wet Prep HPF POC: >=10 — AB (ref <10)

## 2022-06-05 MED ORDER — FLUCONAZOLE 150 MG PO TABS
150.0000 mg | ORAL_TABLET | Freq: Once | ORAL | Status: AC
  Administered 2022-06-05: 150 mg via ORAL
  Filled 2022-06-05: qty 1

## 2022-06-05 MED ORDER — FLUCONAZOLE 150 MG PO TABS: 150.0000 mg | ORAL_TABLET | Freq: Every day | ORAL | 0 refills | Status: DC

## 2022-06-05 NOTE — MAU Provider Note (Signed)
History     CSN: [id number]  Arrival date and time: 06/05/22 1700   Event Date/Time   First Provider Initiated Contact with Patient 06/05/22 1740      Chief Complaint  Patient presents with   Vaginal Discharge   HPI  Kristie Wolf is a 32 y.o. G1P0 at [redacted]w[redacted]d who presents for evaluation of vaginal discharge. Patient reports she is having clumps of greenish yellow discharge coming out when she wipes. She denies any pain. She denies any vaginal bleeding. She does a weekly terazol treatment for yeast.  Denies any constipation, diarrhea or any urinary complaints. Reports normal fetal movement.   OB History     Gravida  1   Para      Term      Preterm      AB      Living         SAB      IAB      Ectopic      Multiple      Live Births              Past Medical History:  Diagnosis Date   Abscess of breast    LEFT   Obesity    Vitamin D deficiency     Past Surgical History:  Procedure Laterality Date   INCISION AND DRAINAGE ABSCESS Left 11/20/2012   Procedure: INCISION AND DRAINAGE LEFT BREAST ABSCESS;  Surgeon: Pedro Earls, MD;  Location: [facility];  Service: General;  Laterality: Left;   WISDOM TOOTH EXTRACTION      Family History  Problem Relation Age of Onset   Hypertension Father    Diabetes Father    Cancer Paternal Grandfather    Asthma Neg Hx    Heart disease Neg Hx    Stroke Neg Hx     Social History   Tobacco Use   Smoking status: Former    Types: Cigarettes, E-cigarettes    Quit date: 01/05/2021    Years since quitting: 1.4   Smokeless tobacco: Never  Vaping Use   Vaping Use: Former   Quit date: 07/07/2021   Substances: Nicotine, Flavoring  Substance Use Topics   Alcohol use: Not Currently    Alcohol/week: 1.0 standard drink of alcohol    Types: 1 Standard drinks or equivalent per week    Comment: occassionally   Drug use: Not Currently    Frequency: 7.0 times per week    Types: Marijuana    Comment: last use weed early  sep 2023    Allergies: No Known Allergies  Medications Prior to Admission  Medication Sig Dispense Refill Last Dose   Prenatal MV & Min w/FA-DHA (PRENATAL GUMMIES PO) Take by mouth.      valACYclovir (VALTREX) 500 MG tablet Take 500 mg by mouth daily.      VITAMIN D PO Take by mouth.       Review of Systems  Constitutional: Negative.  Negative for fatigue and fever.  HENT: Negative.    Respiratory: Negative.  Negative for shortness of breath.   Cardiovascular: Negative.  Negative for chest pain.  Gastrointestinal: Negative.  Negative for abdominal pain, constipation, diarrhea, nausea and vomiting.  Genitourinary:  Positive for vaginal discharge. Negative for dysuria and vaginal bleeding.  Neurological: Negative.  Negative for dizziness and headaches.   Physical Exam   Blood pressure 126/75, pulse 87, temperature 98.8 F (37.1 C), temperature source Oral, resp. rate 20, weight 123.9 kg, last menstrual period 09/06/2021, SpO2  100 %.  Patient Vitals for the past 24 hrs:  BP Temp Temp src Pulse Resp SpO2 Weight  06/05/22 1845 126/75 -- -- 87 -- -- --  06/05/22 1714 122/73 98.8 F (37.1 C) Oral 91 20 100 % --  06/05/22 1710 -- -- -- -- -- -- 123.9 kg    Physical Exam Vitals and nursing note reviewed.  Constitutional:      General: She is not in acute distress.    Appearance: She is well-developed.  HENT:     Head: Normocephalic.  Eyes:     Pupils: Pupils are equal, round, and reactive to light.  Cardiovascular:     Rate and Rhythm: Normal rate and regular rhythm.     Heart sounds: Normal heart sounds.  Pulmonary:     Effort: Pulmonary effort is normal. No respiratory distress.     Breath sounds: Normal breath sounds.  Abdominal:     General: Bowel sounds are normal. There is no distension.     Palpations: Abdomen is soft.     Tenderness: There is no abdominal tenderness.  Skin:    General: Skin is warm and dry.  Neurological:     Mental Status: She is alert and  oriented to person, place, and time.  Psychiatric:        Mood and Affect: Mood normal.        Behavior: Behavior normal.        Thought Content: Thought content normal.        Judgment: Judgment normal.     Fetal Tracing:  Baseline: 130 Variability: moderate Accels: 15x15 Decels: none  Toco:none  MAU Course  Procedures  Results for orders placed or performed during the hospital encounter of 06/05/22 (from the past 24 hour(s))  Wet prep, genital     Status: Abnormal   Collection Time: 06/05/22  6:06 PM  Result Value Ref Range   Yeast Wet Prep HPF POC PRESENT (A) NONE SEEN   Trich, Wet Prep NONE SEEN NONE SEEN   Clue Cells Wet Prep HPF POC PRESENT (A) NONE SEEN   WBC, Wet Prep HPF POC >=10 (A) <10   Sperm NONE SEEN      MDM Labs ordered and reviewed.   Wet prep and gc/chlamydia  CNM discussed options to treat persistent yeast. Patient desires diflucan  Assessment and Plan   1. Candidiasis of vagina during pregnancy   2. [redacted] weeks gestation of pregnancy     -Discharge home in stable condition -Rx for diflucan -Third trimester precautions discussed -Patient advised to follow-up with OB as scheduled for prenatal care -Patient may return to MAU as needed or if her condition were to change or worsen  Wende Mott, CNM 06/05/2022, 5:40 PM

## 2022-06-05 NOTE — Discharge Instructions (Signed)

## 2022-06-05 NOTE — MAU Note (Signed)
Kristie Wolf is a 32 y.o. at [redacted]w[redacted]d here in MAU reporting: green colored vaginal discharge that began yesterday.  Reports discharge doesn't have any odor.  Denies VB and LOF.  Endorses +FM earlier today, but less movement recently. LMP: NA Onset of complaint: last night Pain score: 0 Vitals:   06/05/22 1714  BP: 122/73  Pulse: 91  Resp: 20  Temp: 98.8 F (37.1 C)  SpO2: 100%     FHT:148 bpm Lab orders placed from triage:   None

## 2022-06-06 LAB — GC/CHLAMYDIA PROBE AMP (~~LOC~~) NOT AT ARMC
Chlamydia: NEGATIVE
Comment: NEGATIVE
Comment: NORMAL
Neisseria Gonorrhea: NEGATIVE

## 2022-06-10 ENCOUNTER — Ambulatory Visit: Payer: Medicaid Other | Admitting: *Deleted

## 2022-06-10 ENCOUNTER — Ambulatory Visit: Payer: Medicaid Other | Attending: Maternal & Fetal Medicine

## 2022-06-10 VITALS — BP 114/64 | HR 81

## 2022-06-10 DIAGNOSIS — O99213 Obesity complicating pregnancy, third trimester: Secondary | ICD-10-CM | POA: Insufficient documentation

## 2022-06-10 DIAGNOSIS — R638 Other symptoms and signs concerning food and fluid intake: Secondary | ICD-10-CM | POA: Diagnosis not present

## 2022-06-10 DIAGNOSIS — E669 Obesity, unspecified: Secondary | ICD-10-CM

## 2022-06-10 DIAGNOSIS — Z3A38 38 weeks gestation of pregnancy: Secondary | ICD-10-CM | POA: Diagnosis not present

## 2022-06-16 ENCOUNTER — Ambulatory Visit: Payer: Medicaid Other | Admitting: *Deleted

## 2022-06-16 ENCOUNTER — Other Ambulatory Visit: Payer: Self-pay | Admitting: Maternal & Fetal Medicine

## 2022-06-16 ENCOUNTER — Ambulatory Visit: Payer: Medicaid Other

## 2022-06-16 ENCOUNTER — Ambulatory Visit: Payer: Medicaid Other | Attending: Maternal & Fetal Medicine

## 2022-06-16 VITALS — BP 114/63 | HR 85

## 2022-06-16 DIAGNOSIS — O99213 Obesity complicating pregnancy, third trimester: Secondary | ICD-10-CM | POA: Insufficient documentation

## 2022-06-16 DIAGNOSIS — Z3A39 39 weeks gestation of pregnancy: Secondary | ICD-10-CM

## 2022-06-16 DIAGNOSIS — E669 Obesity, unspecified: Secondary | ICD-10-CM

## 2022-06-16 DIAGNOSIS — O283 Abnormal ultrasonic finding on antenatal screening of mother: Secondary | ICD-10-CM | POA: Insufficient documentation

## 2022-06-16 DIAGNOSIS — R638 Other symptoms and signs concerning food and fluid intake: Secondary | ICD-10-CM | POA: Insufficient documentation

## 2022-06-16 NOTE — Procedures (Signed)
Haeven Yeiter 02-Jun-1990 [redacted]w[redacted]d Fetus A Non-Stress Test Interpretation for 06/16/22  Indication:  abnormal fetal ultrasound, BPP 6/8  Fetal Heart Rate A Mode: External Baseline Rate (A): 135 bpm Variability: Moderate Accelerations: 15 x 15 Decelerations: None Multiple birth?: No  Uterine Activity Mode: Palpation, Toco Contraction Frequency (min): none Resting Tone Palpated: Relaxed  Interpretation (Fetal Testing) Overall Impression: Reassuring for gestational age Comments: Tracing reviewed by Dr SDonalee Citrin

## 2022-06-20 ENCOUNTER — Encounter (HOSPITAL_COMMUNITY): Payer: Self-pay | Admitting: *Deleted

## 2022-06-20 ENCOUNTER — Telehealth (HOSPITAL_COMMUNITY): Payer: Self-pay | Admitting: *Deleted

## 2022-06-20 NOTE — Telephone Encounter (Signed)
Preadmission screen  

## 2022-06-21 ENCOUNTER — Other Ambulatory Visit: Payer: Self-pay

## 2022-06-21 ENCOUNTER — Encounter (HOSPITAL_COMMUNITY): Payer: Self-pay | Admitting: Obstetrics and Gynecology

## 2022-06-21 ENCOUNTER — Inpatient Hospital Stay (HOSPITAL_COMMUNITY): Payer: Medicaid Other

## 2022-06-21 ENCOUNTER — Inpatient Hospital Stay (HOSPITAL_COMMUNITY)
Admission: AD | Admit: 2022-06-21 | Discharge: 2022-06-25 | DRG: 787 | Disposition: A | Discharge status: Home / Self Care | Payer: Medicaid Other | Attending: Obstetrics and Gynecology | Admitting: Obstetrics and Gynecology

## 2022-06-21 DIAGNOSIS — O99824 Streptococcus B carrier state complicating childbirth: Secondary | ICD-10-CM | POA: Diagnosis present

## 2022-06-21 DIAGNOSIS — A6000 Herpesviral infection of urogenital system, unspecified: Secondary | ICD-10-CM | POA: Diagnosis present

## 2022-06-21 DIAGNOSIS — O99210 Obesity complicating pregnancy, unspecified trimester: Secondary | ICD-10-CM | POA: Diagnosis present

## 2022-06-21 DIAGNOSIS — D62 Acute posthemorrhagic anemia: Secondary | ICD-10-CM | POA: Diagnosis not present

## 2022-06-21 DIAGNOSIS — Z87891 Personal history of nicotine dependence: Secondary | ICD-10-CM | POA: Diagnosis not present

## 2022-06-21 DIAGNOSIS — O480 Post-term pregnancy: Secondary | ICD-10-CM | POA: Diagnosis present

## 2022-06-21 DIAGNOSIS — O99214 Obesity complicating childbirth: Principal | ICD-10-CM | POA: Diagnosis present

## 2022-06-21 DIAGNOSIS — Z98891 History of uterine scar from previous surgery: Secondary | ICD-10-CM

## 2022-06-21 DIAGNOSIS — O9832 Other infections with a predominantly sexual mode of transmission complicating childbirth: Secondary | ICD-10-CM | POA: Diagnosis present

## 2022-06-21 DIAGNOSIS — Z3A40 40 weeks gestation of pregnancy: Secondary | ICD-10-CM

## 2022-06-21 DIAGNOSIS — O9902 Anemia complicating childbirth: Secondary | ICD-10-CM | POA: Diagnosis present

## 2022-06-21 DIAGNOSIS — O134 Gestational [pregnancy-induced] hypertension without significant proteinuria, complicating childbirth: Secondary | ICD-10-CM | POA: Diagnosis present

## 2022-06-21 LAB — RPR: RPR Ser Ql: NONREACTIVE

## 2022-06-21 LAB — CBC
HCT: 37.5 % (ref 36.0–46.0)
Hemoglobin: 12.5 g/dL (ref 12.0–15.0)
MCH: 28.1 pg (ref 26.0–34.0)
MCHC: 33.3 g/dL (ref 30.0–36.0)
MCV: 84.3 fL (ref 80.0–100.0)
Platelets: 218 10*3/uL (ref 150–400)
RBC: 4.45 MIL/uL (ref 3.87–5.11)
RDW: 13.7 % (ref 11.5–15.5)
WBC: 10.7 10*3/uL — ABNORMAL HIGH (ref 4.0–10.5)
nRBC: 0 % (ref 0.0–0.2)

## 2022-06-21 LAB — TYPE AND SCREEN
ABO/RH(D): B POS
Antibody Screen: NEGATIVE

## 2022-06-21 MED ORDER — SODIUM CHLORIDE 0.9 % IV SOLN
5.0000 10*6.[IU] | Freq: Once | INTRAVENOUS | Status: AC
  Administered 2022-06-21: 5 10*6.[IU] via INTRAVENOUS
  Filled 2022-06-21: qty 5

## 2022-06-21 MED ORDER — LIDOCAINE HCL (PF) 1 % IJ SOLN: 30.0000 mL | INTRAMUSCULAR | Status: DC | PRN

## 2022-06-21 MED ORDER — PHENYLEPHRINE 80 MCG/ML (10ML) SYRINGE FOR IV PUSH (FOR BLOOD PRESSURE SUPPORT): 80.0000 ug | PREFILLED_SYRINGE | INTRAVENOUS | Status: DC | PRN

## 2022-06-21 MED ORDER — TERBUTALINE SULFATE 1 MG/ML IJ SOLN: 0.2500 mg | Freq: Once | INTRAMUSCULAR | Status: DC | PRN

## 2022-06-21 MED ORDER — ACETAMINOPHEN 325 MG PO TABS: 650.0000 mg | ORAL_TABLET | ORAL | Status: DC | PRN

## 2022-06-21 MED ORDER — OXYCODONE-ACETAMINOPHEN 5-325 MG PO TABS: 2.0000 | ORAL_TABLET | ORAL | Status: DC | PRN

## 2022-06-21 MED ORDER — LACTATED RINGERS IV SOLN: INTRAVENOUS | Status: DC

## 2022-06-21 MED ORDER — DIPHENHYDRAMINE HCL 50 MG/ML IJ SOLN
12.5000 mg | INTRAMUSCULAR | Status: DC | PRN
  Administered 2022-06-22: 12.5 mg via INTRAVENOUS
  Filled 2022-06-21: qty 1

## 2022-06-21 MED ORDER — OXYCODONE-ACETAMINOPHEN 5-325 MG PO TABS: 1.0000 | ORAL_TABLET | ORAL | Status: DC | PRN

## 2022-06-21 MED ORDER — ZOLPIDEM TARTRATE 5 MG PO TABS
5.0000 mg | ORAL_TABLET | Freq: Once | ORAL | Status: AC
  Administered 2022-06-21: 5 mg via ORAL
  Filled 2022-06-21: qty 1

## 2022-06-21 MED ORDER — LACTATED RINGERS IV SOLN
500.0000 mL | INTRAVENOUS | Status: DC | PRN
  Administered 2022-06-22 (×2): 500 mL via INTRAVENOUS

## 2022-06-21 MED ORDER — LACTATED RINGERS IV SOLN: 500.0000 mL | INTRAVENOUS | Status: DC | PRN

## 2022-06-21 MED ORDER — FLEET ENEMA 7-19 GM/118ML RE ENEM: 1.0000 | ENEMA | RECTAL | Status: DC | PRN

## 2022-06-21 MED ORDER — LACTATED RINGERS IV SOLN: 500.0000 mL | Freq: Once | INTRAVENOUS | Status: DC

## 2022-06-21 MED ORDER — EPHEDRINE 5 MG/ML INJ: 10.0000 mg | INTRAVENOUS | Status: DC | PRN

## 2022-06-21 MED ORDER — SOD CITRATE-CITRIC ACID 500-334 MG/5ML PO SOLN
30.0000 mL | ORAL | Status: DC | PRN
  Filled 2022-06-21: qty 30

## 2022-06-21 MED ORDER — HYDROXYZINE HCL 25 MG PO TABS: 25.0000 mg | ORAL_TABLET | Freq: Four times a day (QID) | ORAL | Status: DC | PRN

## 2022-06-21 MED ORDER — ONDANSETRON HCL 4 MG/2ML IJ SOLN: 4.0000 mg | Freq: Four times a day (QID) | INTRAMUSCULAR | Status: DC | PRN

## 2022-06-21 MED ORDER — MISOPROSTOL 25 MCG QUARTER TABLET
25.0000 ug | ORAL_TABLET | ORAL | Status: DC
  Administered 2022-06-21: 25 ug via VAGINAL
  Filled 2022-06-21: qty 1

## 2022-06-21 MED ORDER — OXYTOCIN BOLUS FROM INFUSION: 333.0000 mL | Freq: Once | INTRAVENOUS | Status: DC

## 2022-06-21 MED ORDER — MISOPROSTOL 50MCG HALF TABLET
50.0000 ug | ORAL_TABLET | ORAL | Status: DC
  Administered 2022-06-21: 50 ug via BUCCAL
  Filled 2022-06-21: qty 1

## 2022-06-21 MED ORDER — SOD CITRATE-CITRIC ACID 500-334 MG/5ML PO SOLN: 30.0000 mL | ORAL | Status: DC | PRN

## 2022-06-21 MED ORDER — ACETAMINOPHEN 325 MG PO TABS
650.0000 mg | ORAL_TABLET | ORAL | Status: DC | PRN
  Administered 2022-06-23: 650 mg via ORAL
  Filled 2022-06-21: qty 2

## 2022-06-21 MED ORDER — OXYTOCIN-SODIUM CHLORIDE 30-0.9 UT/500ML-% IV SOLN: 2.5000 [IU]/h | INTRAVENOUS | Status: DC

## 2022-06-21 MED ORDER — PENICILLIN G POT IN DEXTROSE 60000 UNIT/ML IV SOLN
3.0000 10*6.[IU] | INTRAVENOUS | Status: DC
  Administered 2022-06-21 – 2022-06-23 (×11): 3 10*6.[IU] via INTRAVENOUS
  Filled 2022-06-21 (×11): qty 50

## 2022-06-21 MED ORDER — FENTANYL-BUPIVACAINE-NACL 0.5-0.125-0.9 MG/250ML-% EP SOLN
12.0000 mL/h | EPIDURAL | Status: DC | PRN
  Administered 2022-06-22 (×2): 12 mL/h via EPIDURAL
  Filled 2022-06-21 (×2): qty 250

## 2022-06-21 MED ORDER — OXYTOCIN-SODIUM CHLORIDE 30-0.9 UT/500ML-% IV SOLN
1.0000 m[IU]/min | INTRAVENOUS | Status: DC
  Administered 2022-06-21: 2 m[IU]/min via INTRAVENOUS
  Filled 2022-06-21: qty 500

## 2022-06-21 MED ORDER — FENTANYL CITRATE (PF) 100 MCG/2ML IJ SOLN
100.0000 ug | INTRAMUSCULAR | Status: DC | PRN
  Administered 2022-06-21 – 2022-06-22 (×4): 100 ug via INTRAVENOUS
  Filled 2022-06-21 (×4): qty 2

## 2022-06-21 NOTE — Progress Notes (Addendum)
Kristie Wolf is a 32 y.o. G1P0 at 38w1dadmitted for induction of labor due to maternal obesity.   Subjective: Patient feels some contractions.   Objective: BP 137/71   Pulse 87   Temp 98.1 F (36.7 C) (Oral)   Resp 16   LMP 09/06/2021 (Exact Date)  No intake/output data recorded. No intake/output data recorded.  FHT:  FHR: 140 bpm, variability: moderate,  accelerations:  Present,  decelerations:  Absent UC:   irregular, every 4 to 6 minutes SVE:   Dilation: 1 Effacement (%): 50 Station: -2 Exam by:: m wilkins rnc Foley balloon Placement: Speculum placed in. No vulvo/vaginal or cervical lesions seen on exam.  Betadine applied on cervix and wiped with gauze pad.  22 French Foley balloon catheter placed into cervix.  60 cc NS instilled into bulb. Catheter placed under tension with tape to patient's thigh. Patient tolerated the procedure well.   Dr. EWaymon Amato     Labs: Lab Results  Component Value Date   WBC 10.7 (H) 06/21/2022   HGB 12.5 06/21/2022   HCT 37.5 06/21/2022   MCV 84.3 06/21/2022   PLT 218 06/21/2022    Assessment / Plan: 32y.o. G1P0 at 490w1ddmitted for induction of labor due to maternal obesity.   Labor:  S/p 2 sets of vaginal and buccal cytotec.  Foley balloon placed in.  Plan for pitocin start after eating.  Preeclampsia:   None Fetal Wellbeing:  Category I Pain Control:   Pain medication as needed.  I/D:   GBS positive on penicillin.  Anticipated MOD:  NSVD  EmArchie EndoMD 06/21/2022, 6:19 PM

## 2022-06-21 NOTE — H&P (Addendum)
Kristie Wolf is a 32 y.o. female presenting for induction of labor for maternal obesity (BMI at missed  period appointment was 26).  G1P0 at 40 weeks 1 day EGA.  LMP 09/06/21, EDC 06/20/22 by first trimester U/S.   She denies any contractions or vaginal bleeding or leakage of fluid. With normal fetal movement.  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 She has been taking Valtrex for since 36 weeks of pregnancy.  Bilateral carpal tunnel syndrome doing conservative measures.   OB History     Gravida  1   Para      Term      Preterm      AB      Living         SAB      IAB      Ectopic      Multiple      Live Births             Past Medical History:  Diagnosis Date   Abscess of breast    LEFT   Obesity    Vitamin D deficiency    Past Surgical History:  Procedure Laterality Date   INCISION AND DRAINAGE ABSCESS Left 11/20/2012   Procedure: INCISION AND DRAINAGE LEFT BREAST ABSCESS;  Surgeon: Pedro Earls, MD;  Location: [facility];  Service: General;  Laterality: Left;   WISDOM TOOTH EXTRACTION     Family History: family history includes Cancer in her paternal grandfather; Diabetes in her father; Hypertension in her father. Social History:  reports that she quit smoking about 17 months ago. Her smoking use included cigarettes and e-cigarettes. She has never used smokeless tobacco. She reports that she does not currently use alcohol after a past usage of about 1.0 standard drink of alcohol per week. She reports that she does not currently use drugs after having used the following drugs: Marijuana. Frequency: 7.00 times per week.     Maternal Diabetes: No Genetic Screening: Abnormal:  Results: Other: Panorama with insufficient results twice. Negative Horizon testing. With negative AFP4 test.  Maternal Ultrasounds/Referrals: Normal Fetal Ultrasounds or other Referrals:  None Maternal Substance  Abuse:  No Significant Maternal Medications:  None Significant Maternal Lab Results:  Group B Strep positive Number of Prenatal Visits:greater than 3 verified prenatal visits Other Comments:  None  Review of Systems Constitutional: Denies fevers/chills Cardiovascular: Denies chest pain or palpitations Pulmonary: Denies coughing or wheezing Gastrointestinal: Denies nausea, vomiting or diarrhea Genitourinary: Denies pelvic pain, unusual vaginal bleeding, unusual vaginal discharge, dysuria, urgency or frequency.  Musculoskeletal: Denies muscle or joint aches and pain.  Neurology: Denies abnormal sensations such as tingling or numbness.   History Dilation: Closed (MD stated she wanted to do Sve to check pelvic room) Effacement (%): 50 Station: -3 Exam by:: Jurnee Nakayama. E MD Blood pressure 137/71, pulse 87, temperature 98.1 F (36.7 C), temperature source Oral, resp. rate 16, last menstrual period 09/06/2021. EFM: 130 BL, moderate variability, reactive. TOCO: With irregular contractions every 2 to 4 minutes.   Exam Physical Exam  Constitutional: She is oriented to person, place, and time. She appears well-developed and well-nourished.  HENT:  Head: Normocephalic and atraumatic.  Neck: Normal range of motion.  Cardiovascular: Normal rate.    Respiratory: Effort normal.   GI: Soft.  Skin: Skin is warm and dry.  Psychiatric: She has a normal mood and affect. Her behavior is normal.   Genitourinary: Gravid uterus,  appropriate for gestational age, EFW by [place] 8.5 lbs, with adequate pelvis.      Prenatal labs: ABO, Rh: --/--/B POS (02/20 0740) Antibody: NEG (02/20 0740) Rubella: Immune (08/11 0000) RPR: Nonreactive (08/11 0000)  HBsAg: Negative (08/11 0000)  HIV: Non-reactive (08/11 0000)  GBS: Positive/-- (01/26 0000)   Recent Results (from the past 2160 hour(s))  OB RESULT CONSOLE Group B Strep     Status: None   Collection Time: 05/27/22 12:00 AM  Result Value Ref Range   GBS  Positive   GC/Chlamydia probe amp ([place])not at [facility]     Status: None   Collection Time: 06/05/22  5:26 PM  Result Value Ref Range   Neisseria Gonorrhea Negative    Chlamydia Negative    Comment Normal Reference [place] Chlamydia - Negative    Comment      Normal Reference Range Neisseria Gonorrhea - Negative  Wet prep, genital     Status: Abnormal   Collection Time: 06/05/22  6:06 PM  Result Value Ref Range   Yeast Wet Prep HPF POC PRESENT (A) NONE SEEN   Trich, Wet Prep NONE SEEN NONE SEEN   Clue Cells Wet Prep HPF POC PRESENT (A) NONE SEEN   WBC, Wet Prep HPF POC >=10 (A) <10   Sperm NONE SEEN     Comment: Performed at [facility], [address] [place], [place] [address]  Type and screen [place]     Status: None   Collection Time: 06/21/22  7:40 AM  Result Value Ref Range   ABO/RH(D) B POS    Antibody Screen NEG    Sample Expiration      06/24/2022,2359 Performed at [facility], [place] [address], [place] [address]   CBC     Status: Abnormal   Collection Time: 06/21/22  7:51 AM  Result Value Ref Range   WBC 10.7 (H) 4.0 - 10.5 K/uL   RBC 4.45 3.87 - 5.11 MIL/uL   Hemoglobin 12.5 12.0 - 15.0 g/dL   HCT 37.5 36.0 - 46.0 %   MCV 84.3 80.0 - 100.0 fL   MCH 28.1 26.0 - 34.0 pg   MCHC 33.3 30.0 - 36.0 g/dL   RDW 13.7 11.5 - 15.5 %   Platelets 218 150 - 400 K/uL   nRBC 0.0 0.0 - 0.2 %    Comment: Performed at [facility], [place] [address] [place], [place] [address]    Assessment/Plan: 32 y/o G1P0 at 40 weeks 1 day EGA here for induction of labor for maternal obesity in pregnancy, - Admit to Labor and Delivery.  - NPO and IV fluids.  - I discussed with patient risks, benefits and alternatives of labor induction including risks of cesarean delivery.  We discussed risks of induction agents including effects on fetal heart beat, contraction pattern and need for close monitoring.  Patient expressed understanding of all this  and desired to proceed with the induction.   Will start with buccal and vaginal cytotec induction.   - Penicillin for GBS prophylaxis.   Archie Endo, MD.  06/21/2022, 10:21 AM

## 2022-06-22 ENCOUNTER — Inpatient Hospital Stay (HOSPITAL_COMMUNITY): Payer: Medicaid Other | Admitting: Anesthesiology

## 2022-06-22 DIAGNOSIS — Z3A40 40 weeks gestation of pregnancy: Secondary | ICD-10-CM

## 2022-06-22 DIAGNOSIS — O99214 Obesity complicating childbirth: Secondary | ICD-10-CM

## 2022-06-22 LAB — COMPREHENSIVE METABOLIC PANEL
ALT: 20 U/L (ref 0–44)
AST: 26 U/L (ref 15–41)
Albumin: 2.3 g/dL — ABNORMAL LOW (ref 3.5–5.0)
Alkaline Phosphatase: 193 U/L — ABNORMAL HIGH (ref 38–126)
Anion gap: 9 (ref 5–15)
BUN: <5 mg/dL — ABNORMAL LOW (ref 6–20)
CO2: 19 mmol/L — ABNORMAL LOW (ref 22–32)
Calcium: 9.1 mg/dL (ref 8.9–10.3)
Chloride: 104 mmol/L (ref 98–111)
Creatinine, Ser: 0.62 mg/dL (ref 0.44–1.00)
GFR, Estimated: >60 mL/min (ref >60)
Glucose, Bld: 102 mg/dL — ABNORMAL HIGH (ref 70–99)
Potassium: 3.8 mmol/L (ref 3.5–5.1)
Sodium: 132 mmol/L — ABNORMAL LOW (ref 135–145)
Total Bilirubin: 1.5 mg/dL — ABNORMAL HIGH (ref 0.3–1.2)
Total Protein: 6.6 g/dL (ref 6.5–8.1)

## 2022-06-22 LAB — CBC
HCT: 35.7 % — ABNORMAL LOW (ref 36.0–46.0)
Hemoglobin: 12.2 g/dL (ref 12.0–15.0)
MCH: 28.4 pg (ref 26.0–34.0)
MCHC: 34.2 g/dL (ref 30.0–36.0)
MCV: 83 fL (ref 80.0–100.0)
Platelets: 223 10*3/uL (ref 150–400)
RBC: 4.3 MIL/uL (ref 3.87–5.11)
RDW: 13.6 % (ref 11.5–15.5)
WBC: 13.5 10*3/uL — ABNORMAL HIGH (ref 4.0–10.5)
nRBC: 0 % (ref 0.0–0.2)

## 2022-06-22 LAB — PROTEIN / CREATININE RATIO, URINE
Creatinine, Urine: 86 mg/dL
Protein Creatinine Ratio: 0.14 mg/mg{Cre} (ref 0.00–0.15)
Total Protein, Urine: 12 mg/dL

## 2022-06-22 MED ORDER — LACTATED RINGERS IV SOLN
500.0000 mL | Freq: Once | INTRAVENOUS | Status: AC
  Administered 2022-06-22: 500 mL via INTRAVENOUS

## 2022-06-22 MED ORDER — LACTATED RINGERS AMNIOINFUSION: INTRAVENOUS | Status: DC

## 2022-06-22 MED ORDER — DEXTROSE IN LACTATED RINGERS 5 % IV SOLN
INTRAVENOUS | Status: AC
  Administered 2022-06-22: 300 mL via INTRAVENOUS

## 2022-06-22 MED ORDER — DEXTROSE IN LACTATED RINGERS 5 % IV SOLN: INTRAVENOUS | Status: DC

## 2022-06-22 MED ORDER — LIDOCAINE HCL (PF) 1 % IJ SOLN
INTRAMUSCULAR | Status: DC | PRN
  Administered 2022-06-22: 5 mL via EPIDURAL
  Administered 2022-06-22: 2 mL via EPIDURAL
  Administered 2022-06-22: 3 mL via EPIDURAL

## 2022-06-22 NOTE — Progress Notes (Signed)
Patient ID: Kristie Wolf, female   DOB: December 12, 1990, 32 y.o.   MRN: [id number]  Tracing reviewed remotely cat 1 s/po D5LR, IVFs and position change Pitocin titrating to MVUs of 18-220

## 2022-06-22 NOTE — Plan of Care (Signed)

## 2022-06-22 NOTE — Progress Notes (Addendum)
Kristie Wolf is a 32 y.o. G1P0 at [redacted]w[redacted]d  Subjective: Comfortable with epidural  Objective: BP 124/79 (BP Location: Left Arm)   Pulse 88   Temp 98 F (36.7 C) (Oral)   Resp 18   Ht 5' 3"$  (1.6 m)   Wt 126.7 kg   LMP 09/06/2021 (Exact Date)   SpO2 99%   BMI 49.48 kg/m  No intake/output data recorded. Total I/O In: 1022.7 [P.O.:400; I.V.:572.7; IV Piggyback:50] Out: 400 [Urine:400]  FHT:  FHR: 130 bpm, variability: moderate,  accelerations:  Present,  decelerations:  Absent except while laying on back with AROM UC:   regular, every 2-3 minutes SVE:   Dilation: 5 Effacement (%): 60 Station: -3 Exam by:: AEverett Graff MD AROM clear fluid  Labs: Lab Results  Component Value Date   WBC 10.7 (H) 06/21/2022   HGB 12.5 06/21/2022   HCT 37.5 06/21/2022   MCV 84.3 06/21/2022   PLT 218 06/21/2022   EFW per pt 05/27/22 7lbs  Assessment / Plan: Induction of labor due to increased BMI,  progressing well on pitocin  Labor:  Progressing on pitocin.  IUPC placed, will titrate pitocin to MVUs of 180-220. Preeclampsia:  no signs or symptoms of toxicity Fetal Wellbeing:  Category I overall.  Early decelerations with moderate variability. Pain Control:  Epidural I/D:   GBS positive on PCN Anticipated MOD:  NSVD  ADelice Lesch MD 06/22/2022, 1:26 PM

## 2022-06-22 NOTE — Anesthesia Procedure Notes (Signed)
Epidural Patient location during procedure: OB Start time: 06/22/2022 7:08 AM End time: 06/22/2022 7:15 AM  Staffing Anesthesiologist: Santa Lighter, MD Performed: anesthesiologist   Preanesthetic Checklist Completed: patient identified, IV checked, risks and benefits discussed, monitors and equipment checked, pre-op evaluation and timeout performed  Epidural Patient position: sitting Prep: DuraPrep Patient monitoring: blood pressure and continuous pulse ox Approach: midline Location: L3-L4 Injection technique: LOR air  Needle:  Needle type: Tuohy  Needle gauge: 17 G Needle length: 9 cm Needle insertion depth: 9 cm Catheter size: 19 Gauge Catheter at skin depth: 15 cm Test dose: negative and Other (1% Lidocaine)  Additional Notes Patient identified.  Risk benefits discussed including failed block, incomplete pain control, headache, nerve damage, paralysis, blood pressure changes, nausea, vomiting, reactions to medication both toxic or allergic, and postpartum back pain.  Patient expressed understanding and wished to proceed.  All questions were answered.  Sterile technique used throughout procedure and epidural site dressed with sterile barrier dressing. No paresthesia or other complications noted. The patient did not experience any signs of intravascular injection such as tinnitus or metallic taste in mouth nor signs of intrathecal spread such as rapid motor block. Please see nursing notes for vital signs. Reason for block:procedure for pain

## 2022-06-22 NOTE — Progress Notes (Signed)
Kristie Wolf is a 32 y.o. G1P0 at 27w2dadmitted for induction of labor for maternal obesity.  Chart reviewed remotely.  Objective: BP 135/76   Pulse 83   Temp 98.4 F (36.9 C) (Oral)   Resp 20   LMP 09/06/2021 (Exact Date)  No intake/output data recorded. No intake/output data recorded.  FHT:  FHR: 120 bpm, variability: moderate,  accelerations:  Present,  decelerations:  Absent UC:   regular, every 2 to 3 minutes SVE:   Dilation: 3 Effacement (%): 60 Station: -3 Exam by:: Gillen, RN  Labs: Lab Results  Component Value Date   WBC 10.7 (H) 06/21/2022   HGB 12.5 06/21/2022   HCT 37.5 06/21/2022   MCV 84.3 06/21/2022   PLT 218 06/21/2022    Assessment / Plan: 32y.o. G1P0 at 425w1ddmitted for induction of labor due to maternal obesity.    Labor:  S/p vaginal and buccal cytotec.  s/p Foley balloon placed in. On pitocin titration.  Preeclampsia:   None Fetal Wellbeing:  Category I Pain Control:   Pain medication as needed.  I/D:   GBS positive on penicillin.  Anticipated MOD:  NSVD  EmArchie EndoMD 06/22/2022, 4:43 AM

## 2022-06-22 NOTE — Progress Notes (Signed)
Kristie Wolf is a 32 y.o. G1P0 at 1w2dadmitted for induction of labor due to increased BMI.  Subjective: Comfortable with epidural  Objective: BP 137/79 (BP Location: Left Arm)   Pulse 84   Temp 98.2 F (36.8 C) (Oral)   Resp 18   Ht 5' 3"$  (1.6 m)   Wt 126.7 kg   LMP 09/06/2021 (Exact Date)   SpO2 100%   BMI 49.48 kg/m  I/O last 3 completed shifts: In: 2427.4 [P.O.:700; I.V.:1677.4; IV Piggyback:50] Out: 1550 [Urine:1550] No intake/output data recorded.  FHT:  FHR: 125 bpm, variability: moderate,  accelerations:  Present,  decelerations:  Absent UC:   regular, every 3-4 minutes SVE:   Dilation: 5 Effacement (%): 60 Station: -3 Exam by:: Dr RMancel Bale Labs: Lab Results  Component Value Date   WBC 10.7 (H) 06/21/2022   HGB 12.5 06/21/2022   HCT 37.5 06/21/2022   MCV 84.3 06/21/2022   PLT 218 06/21/2022   Confirmed vtx by bedside ultrasound  Assessment / Plan: Induction of labor due to BMI,  progressing well on pitocin  Labor:  Continue to titrate pitocin as indicated.  Offered pt c/s due to prolonged latent phase.  She declines and says she wants c/s as last resort. Preeclampsia:   no s/sxs Fetal Wellbeing:  Category I Pain Control:  Epidural I/D:   GBS positive on PCN.  No s/sxs of infection. Anticipated MOD:   to be determined  ADelice Lesch MD 06/22/2022, 7:04 PM

## 2022-06-22 NOTE — Anesthesia Preprocedure Evaluation (Signed)
Anesthesia Evaluation  Patient identified by MRN, date of birth, ID band Patient awake    Reviewed: Allergy & Precautions, NPO status , Patient's Chart, lab work & pertinent test results  Airway Mallampati: III  TM Distance: >3 FB Neck ROM: Full    Dental  (+) Teeth Intact, Dental Advisory Given   Pulmonary former smoker   Pulmonary exam normal breath sounds clear to auscultation       Cardiovascular negative cardio ROS Normal cardiovascular exam Rhythm:Regular Rate:Normal     Neuro/Psych negative neurological ROS     GI/Hepatic negative GI ROS, Neg liver ROS,,,  Endo/Other    Morbid obesity  Renal/GU negative Renal ROS     Musculoskeletal negative musculoskeletal ROS (+)    Abdominal   Peds  Hematology negative hematology ROS (+) Plt 218k   Anesthesia Other Findings Day of surgery medications reviewed with the patient.  Reproductive/Obstetrics (+) Pregnancy                             Anesthesia Physical Anesthesia Plan  ASA: 3  Anesthesia Plan: Epidural   Post-op Pain Management:    Induction:   PONV Risk Score and Plan: 2 and Treatment may vary due to age or medical condition  Airway Management Planned: Natural Airway  Additional Equipment:   Intra-op Plan:   Post-operative Plan:   Informed Consent: I have reviewed the patients History and Physical, chart, labs and discussed the procedure including the risks, benefits and alternatives for the proposed anesthesia with the patient or authorized representative who has indicated his/her understanding and acceptance.     Dental advisory given  Plan Discussed with:   Anesthesia Plan Comments: (Patient identified. Risks/Benefits/Options discussed with patient including but not limited to bleeding, infection, nerve damage, paralysis, failed block, incomplete pain control, headache, blood pressure changes, nausea, vomiting,  reactions to medication both or allergic, itching and postpartum back pain. Confirmed with bedside nurse the patient's most recent platelet count. Confirmed with patient that they are not currently taking any anticoagulation, have any bleeding history or any family history of bleeding disorders. Patient expressed understanding and wished to proceed. All questions were answered. )       Anesthesia Quick Evaluation

## 2022-06-22 NOTE — Progress Notes (Signed)
   Subjective:  Patient is comfortable with her epidural . She denies feeling pressure.   Objective: BP (!) 140/75   Pulse 88   Temp 98.2 F (36.8 C) (Oral)   Resp 18   Ht 5' 3"$  (1.6 m)   Wt 126.7 kg   LMP 09/06/2021 (Exact Date)   SpO2 100%   BMI 49.48 kg/m  I/O last 3 completed shifts: In: 2523.2 [P.O.:700; I.V.:1773.2; IV Piggyback:50] Out: [id number] [Urine:1550] No intake/output data recorded.  FHT:  FHR: 130 bpm, variability: moderate,  accelerations:  Present,  decelerations:  Present variable UC:   regular, every 3-4 minutes SVE:   Dilation: 5.5 Effacement (%): 80 Station: -3 Exam by:: Dr. Landry Mellow  Labs: Lab Results  Component Value Date   WBC 10.7 (H) 06/21/2022   HGB 12.5 06/21/2022   HCT 37.5 06/21/2022   MCV 84.3 06/21/2022   PLT 218 06/21/2022    Assessment / Plan: Induction of labor due to morbid obesity  BMI 49  Labor:  prolonged active phase.. pt declines cesarean section at this time.  Preeclampsia:   elevated bp meeting criteria for gestational hypertension will order [facility] labs  Fetal Wellbeing:  Category I and Category II Overall reassuring. Variable decels present will start amnioinfusion  Pain Control:  Epidural I/D:   Penicillin Anticipated MOD:   undetermined  Christophe Louis, MD 06/22/2022, 10:40 PM

## 2022-06-22 NOTE — Progress Notes (Addendum)
Patient ID: Kristie Wolf, female   DOB: Aug 05, 1990, 32 y.o.   MRN: [id number] Cat 1 tracing reviewed remotely by me

## 2022-06-23 ENCOUNTER — Encounter (HOSPITAL_COMMUNITY): Payer: Self-pay | Admitting: Obstetrics & Gynecology

## 2022-06-23 ENCOUNTER — Encounter (HOSPITAL_COMMUNITY)
Admission: AD | Disposition: A | Discharge status: Home / Self Care | Payer: Self-pay | Source: Home / Self Care | Attending: Obstetrics and Gynecology

## 2022-06-23 LAB — CBC
HCT: 37.6 % (ref 36.0–46.0)
Hemoglobin: 12.5 g/dL (ref 12.0–15.0)
MCH: 27.8 pg (ref 26.0–34.0)
MCHC: 33.2 g/dL (ref 30.0–36.0)
MCV: 83.6 fL (ref 80.0–100.0)
Platelets: 233 10*3/uL (ref 150–400)
RBC: 4.5 MIL/uL (ref 3.87–5.11)
RDW: 13.7 % (ref 11.5–15.5)
WBC: 15.9 10*3/uL — ABNORMAL HIGH (ref 4.0–10.5)
nRBC: 0 % (ref 0.0–0.2)

## 2022-06-23 SURGERY — Surgical Case
Anesthesia: Epidural

## 2022-06-23 MED ORDER — OXYCODONE HCL 5 MG PO TABS: 5.0000 mg | ORAL_TABLET | Freq: Once | ORAL | Status: DC | PRN

## 2022-06-23 MED ORDER — DIPHENHYDRAMINE HCL 25 MG PO CAPS: 25.0000 mg | ORAL_CAPSULE | ORAL | Status: DC | PRN

## 2022-06-23 MED ORDER — AMISULPRIDE (ANTIEMETIC) 5 MG/2ML IV SOLN: 10.0000 mg | Freq: Once | INTRAVENOUS | Status: DC | PRN

## 2022-06-23 MED ORDER — SIMETHICONE 80 MG PO CHEW: 80.0000 mg | CHEWABLE_TABLET | ORAL | Status: DC | PRN

## 2022-06-23 MED ORDER — ONDANSETRON HCL 4 MG/2ML IJ SOLN
INTRAMUSCULAR | Status: AC
  Filled 2022-06-23: qty 2

## 2022-06-23 MED ORDER — OXYCODONE HCL 5 MG/5ML PO SOLN: 5.0000 mg | Freq: Once | ORAL | Status: DC | PRN

## 2022-06-23 MED ORDER — SENNOSIDES-DOCUSATE SODIUM 8.6-50 MG PO TABS
2.0000 | ORAL_TABLET | Freq: Every day | ORAL | Status: DC
  Administered 2022-06-24 – 2022-06-25 (×2): 2 via ORAL
  Filled 2022-06-23 (×2): qty 2

## 2022-06-23 MED ORDER — TRANEXAMIC ACID-NACL 1000-0.7 MG/100ML-% IV SOLN
INTRAVENOUS | Status: AC
  Filled 2022-06-23: qty 100

## 2022-06-23 MED ORDER — MENTHOL 3 MG MT LOZG: 1.0000 | LOZENGE | OROMUCOSAL | Status: DC | PRN

## 2022-06-23 MED ORDER — ACETAMINOPHEN 10 MG/ML IV SOLN
INTRAVENOUS | Status: AC
  Filled 2022-06-23: qty 500

## 2022-06-23 MED ORDER — ZOLPIDEM TARTRATE 5 MG PO TABS: 5.0000 mg | ORAL_TABLET | Freq: Every evening | ORAL | Status: DC | PRN

## 2022-06-23 MED ORDER — SCOPOLAMINE 1 MG/3DAYS TD PT72
MEDICATED_PATCH | TRANSDERMAL | Status: AC
  Filled 2022-06-23: qty 1

## 2022-06-23 MED ORDER — FENTANYL CITRATE (PF) 100 MCG/2ML IJ SOLN: 25.0000 ug | INTRAMUSCULAR | Status: DC | PRN

## 2022-06-23 MED ORDER — LIDOCAINE-EPINEPHRINE (PF) 2 %-1:200000 IJ SOLN
INTRAMUSCULAR | Status: AC
  Filled 2022-06-23: qty 20

## 2022-06-23 MED ORDER — OXYTOCIN-SODIUM CHLORIDE 30-0.9 UT/500ML-% IV SOLN
2.5000 [IU]/h | INTRAVENOUS | Status: AC
  Administered 2022-06-23: 2.5 [IU]/h via INTRAVENOUS
  Filled 2022-06-23: qty 500

## 2022-06-23 MED ORDER — ACETAMINOPHEN 500 MG PO TABS
1000.0000 mg | ORAL_TABLET | Freq: Four times a day (QID) | ORAL | Status: DC
  Administered 2022-06-23 – 2022-06-25 (×7): 1000 mg via ORAL
  Filled 2022-06-23 (×8): qty 2

## 2022-06-23 MED ORDER — OXYCODONE-ACETAMINOPHEN 5-325 MG PO TABS: 1.0000 | ORAL_TABLET | ORAL | Status: DC | PRN

## 2022-06-23 MED ORDER — OXYTOCIN-SODIUM CHLORIDE 30-0.9 UT/500ML-% IV SOLN
INTRAVENOUS | Status: DC | PRN
  Administered 2022-06-23: 300 mL via INTRAVENOUS

## 2022-06-23 MED ORDER — WITCH HAZEL-GLYCERIN EX PADS: 1.0000 | MEDICATED_PAD | CUTANEOUS | Status: DC | PRN

## 2022-06-23 MED ORDER — SODIUM CHLORIDE 0.9% FLUSH: 3.0000 mL | INTRAVENOUS | Status: DC | PRN

## 2022-06-23 MED ORDER — LIDOCAINE-EPINEPHRINE (PF) 2 %-1:200000 IJ SOLN
INTRAMUSCULAR | Status: DC | PRN
  Administered 2022-06-23: 5 mL via EPIDURAL
  Administered 2022-06-23: 10 mL via EPIDURAL
  Administered 2022-06-23: 5 mL via EPIDURAL
  Administered 2022-06-23: 2 mL via EPIDURAL

## 2022-06-23 MED ORDER — CEFAZOLIN IN SODIUM CHLORIDE 3-0.9 GM/100ML-% IV SOLN
3.0000 g | INTRAVENOUS | Status: AC
  Administered 2022-06-23: 3 g via INTRAVENOUS
  Filled 2022-06-23: qty 100

## 2022-06-23 MED ORDER — DIPHENHYDRAMINE HCL 50 MG/ML IJ SOLN: 12.5000 mg | INTRAMUSCULAR | Status: DC | PRN

## 2022-06-23 MED ORDER — ONDANSETRON HCL 4 MG/2ML IJ SOLN
INTRAMUSCULAR | Status: DC | PRN
  Administered 2022-06-23: 4 mg via INTRAVENOUS

## 2022-06-23 MED ORDER — SODIUM CHLORIDE 0.9 % IV SOLN
500.0000 mg | INTRAVENOUS | Status: AC
  Administered 2022-06-23: 500 mg via INTRAVENOUS

## 2022-06-23 MED ORDER — DIBUCAINE (PERIANAL) 1 % EX OINT: 1.0000 | TOPICAL_OINTMENT | CUTANEOUS | Status: DC | PRN

## 2022-06-23 MED ORDER — PRENATAL MULTIVITAMIN CH
1.0000 | ORAL_TABLET | Freq: Every day | ORAL | Status: DC
  Administered 2022-06-23 – 2022-06-24 (×2): 1 via ORAL
  Filled 2022-06-23 (×2): qty 1

## 2022-06-23 MED ORDER — KETOROLAC TROMETHAMINE 30 MG/ML IJ SOLN
INTRAMUSCULAR | Status: AC
  Filled 2022-06-23: qty 1

## 2022-06-23 MED ORDER — METOCLOPRAMIDE HCL 5 MG/ML IJ SOLN
INTRAMUSCULAR | Status: AC
  Filled 2022-06-23: qty 2

## 2022-06-23 MED ORDER — DEXAMETHASONE SODIUM PHOSPHATE 4 MG/ML IJ SOLN
INTRAMUSCULAR | Status: DC | PRN
  Administered 2022-06-23: 4 mg via INTRAVENOUS

## 2022-06-23 MED ORDER — LACTATED RINGERS IV SOLN: INTRAVENOUS | Status: DC | PRN

## 2022-06-23 MED ORDER — ACETAMINOPHEN 10 MG/ML IV SOLN: 1000.0000 mg | Freq: Once | INTRAVENOUS | Status: DC | PRN

## 2022-06-23 MED ORDER — FENTANYL CITRATE (PF) 100 MCG/2ML IJ SOLN
INTRAMUSCULAR | Status: DC | PRN
  Administered 2022-06-23: 100 ug via EPIDURAL

## 2022-06-23 MED ORDER — SCOPOLAMINE 1 MG/3DAYS TD PT72
1.0000 | MEDICATED_PATCH | Freq: Once | TRANSDERMAL | Status: DC
  Administered 2022-06-23: 1.5 mg via TRANSDERMAL

## 2022-06-23 MED ORDER — FENTANYL CITRATE (PF) 100 MCG/2ML IJ SOLN
INTRAMUSCULAR | Status: AC
  Filled 2022-06-23: qty 2

## 2022-06-23 MED ORDER — MORPHINE SULFATE (PF) 0.5 MG/ML IJ SOLN
INTRAMUSCULAR | Status: DC | PRN
  Administered 2022-06-23: 3 mg via EPIDURAL

## 2022-06-23 MED ORDER — ONDANSETRON HCL 4 MG/2ML IJ SOLN: 4.0000 mg | Freq: Three times a day (TID) | INTRAMUSCULAR | Status: DC | PRN

## 2022-06-23 MED ORDER — MORPHINE SULFATE (PF) 0.5 MG/ML IJ SOLN
INTRAMUSCULAR | Status: AC
  Filled 2022-06-23: qty 10

## 2022-06-23 MED ORDER — SODIUM CHLORIDE 0.9 % IV SOLN
INTRAVENOUS | Status: AC
  Filled 2022-06-23: qty 5

## 2022-06-23 MED ORDER — PROMETHAZINE HCL 25 MG/ML IJ SOLN: 6.2500 mg | INTRAMUSCULAR | Status: DC | PRN

## 2022-06-23 MED ORDER — NALOXONE HCL 0.4 MG/ML IJ SOLN: 0.4000 mg | INTRAMUSCULAR | Status: DC | PRN

## 2022-06-23 MED ORDER — MEPERIDINE HCL 25 MG/ML IJ SOLN: 6.2500 mg | INTRAMUSCULAR | Status: DC | PRN

## 2022-06-23 MED ORDER — COCONUT OIL OIL: 1.0000 | TOPICAL_OIL | Status: DC | PRN

## 2022-06-23 MED ORDER — KETOROLAC TROMETHAMINE 30 MG/ML IJ SOLN
30.0000 mg | Freq: Once | INTRAMUSCULAR | Status: AC
  Administered 2022-06-23: 30 mg via INTRAVENOUS

## 2022-06-23 MED ORDER — PHENYLEPHRINE 80 MCG/ML (10ML) SYRINGE FOR IV PUSH (FOR BLOOD PRESSURE SUPPORT)
PREFILLED_SYRINGE | INTRAVENOUS | Status: AC
  Filled 2022-06-23: qty 10

## 2022-06-23 MED ORDER — OXYTOCIN-SODIUM CHLORIDE 30-0.9 UT/500ML-% IV SOLN
INTRAVENOUS | Status: AC
  Filled 2022-06-23: qty 500

## 2022-06-23 MED ORDER — SOD CITRATE-CITRIC ACID 500-334 MG/5ML PO SOLN
30.0000 mL | ORAL | Status: AC
  Administered 2022-06-23: 30 mL via ORAL

## 2022-06-23 MED ORDER — DEXAMETHASONE SODIUM PHOSPHATE 4 MG/ML IJ SOLN
INTRAMUSCULAR | Status: AC
  Filled 2022-06-23: qty 1

## 2022-06-23 MED ORDER — NALOXONE HCL 4 MG/10ML IJ SOLN: 1.0000 ug/kg/h | INTRAVENOUS | Status: DC | PRN

## 2022-06-23 MED ORDER — DIPHENHYDRAMINE HCL 25 MG PO CAPS
25.0000 mg | ORAL_CAPSULE | Freq: Four times a day (QID) | ORAL | Status: DC | PRN
  Administered 2022-06-23: 25 mg via ORAL
  Filled 2022-06-23: qty 1

## 2022-06-23 MED ORDER — SIMETHICONE 80 MG PO CHEW
80.0000 mg | CHEWABLE_TABLET | Freq: Three times a day (TID) | ORAL | Status: DC
  Administered 2022-06-23 – 2022-06-25 (×6): 80 mg via ORAL
  Filled 2022-06-23 (×6): qty 1

## 2022-06-23 SURGICAL SUPPLY — 42 items
APL PRP STRL LF DISP 70% ISPRP (MISCELLANEOUS) ×2
APL SKNCLS STERI-STRIP NONHPOA (GAUZE/BANDAGES/DRESSINGS) ×1
BENZOIN TINCTURE PRP APPL 2/3 (GAUZE/BANDAGES/DRESSINGS) ×1 IMPLANT
CHLORAPREP W/TINT 26 (MISCELLANEOUS) ×2 IMPLANT
CLAMP UMBILICAL CORD (MISCELLANEOUS) ×1 IMPLANT
CLOTH BEACON ORANGE TIMEOUT ST (SAFETY) ×1 IMPLANT
DRAIN JACKSON PRT FLT 10 (DRAIN) IMPLANT
DRSG OPSITE POSTOP 4X10 (GAUZE/BANDAGES/DRESSINGS) ×1 IMPLANT
ELECT REM PT RETURN 9FT ADLT (ELECTROSURGICAL) ×1
ELECTRODE REM PT RTRN 9FT ADLT (ELECTROSURGICAL) ×1 IMPLANT
EVACUATOR SILICONE 100CC (DRAIN) IMPLANT
EXTRACTOR VACUUM M CUP 4 TUBE (SUCTIONS) IMPLANT
GAUZE SPONGE 4X4 12PLY STRL LF (GAUZE/BANDAGES/DRESSINGS) IMPLANT
GLOVE BIO SURGEON STRL SZ 6.5 (GLOVE) ×1 IMPLANT
GLOVE BIOGEL PI IND STRL 7.0 (GLOVE) ×2 IMPLANT
GOWN STRL REUS W/TWL LRG LVL3 (GOWN DISPOSABLE) ×2 IMPLANT
HEMOSTAT SURGICEL 2X14 (HEMOSTASIS) IMPLANT
KIT ABG SYR 3ML LUER SLIP (SYRINGE) IMPLANT
MAT PREVALON FULL STRYKER (MISCELLANEOUS) IMPLANT
NDL HYPO 25X5/8 SAFETYGLIDE (NEEDLE) IMPLANT
NEEDLE HYPO 25X5/8 SAFETYGLIDE (NEEDLE) IMPLANT
NS IRRIG 1000ML POUR BTL (IV SOLUTION) ×1 IMPLANT
PACK C SECTION WH (CUSTOM PROCEDURE TRAY) ×1 IMPLANT
PAD OB MATERNITY 4.3X12.25 (PERSONAL CARE ITEMS) ×1 IMPLANT
RETRACTOR TRAXI PANNICULUS (MISCELLANEOUS) IMPLANT
RTRCTR C-SECT PINK 25CM LRG (MISCELLANEOUS) IMPLANT
STRIP CLOSURE SKIN 1/2X4 (GAUZE/BANDAGES/DRESSINGS) ×1 IMPLANT
SUT CHROMIC 0 CT 1 (SUTURE) ×1 IMPLANT
SUT MNCRL AB 3-0 PS2 27 (SUTURE) ×1 IMPLANT
SUT PLAIN 2 0 (SUTURE) ×2
SUT PLAIN 2 0 XLH (SUTURE) ×1 IMPLANT
SUT PLAIN ABS 2-0 CT1 27XMFL (SUTURE) ×2 IMPLANT
SUT SILK 2 0 SH (SUTURE) IMPLANT
SUT VIC AB 0 CTX 36 (SUTURE) ×4
SUT VIC AB 0 CTX36XBRD ANBCTRL (SUTURE) ×4 IMPLANT
SUT VIC AB 2-0 SH 27 (SUTURE)
SUT VIC AB 2-0 SH 27XBRD (SUTURE) IMPLANT
SUT VIC AB 3-0 SH 27 (SUTURE) ×1
SUT VIC AB 3-0 SH 27X BRD (SUTURE) IMPLANT
TOWEL OR 17X24 6PK STRL BLUE (TOWEL DISPOSABLE) ×1 IMPLANT
TRAY FOLEY W/BAG SLVR 14FR LF (SET/KITS/TRAYS/PACK) ×1 IMPLANT
WATER STERILE IRR 1000ML POUR (IV SOLUTION) ×1 IMPLANT

## 2022-06-23 NOTE — Progress Notes (Signed)
  Subjective: Patient is comfortable with her epidural. She denies feeling pressure. She reports headache early however it resolved with tylenol. No visual changes.   Objective: BP 123/74   Pulse 80   Temp 98.1 F (36.7 C)   Resp 18   Ht 5' 3"$  (1.6 m)   Wt 126.7 kg   LMP 09/06/2021 (Exact Date)   SpO2 98%   BMI 49.48 kg/m  I/O last 3 completed shifts: In: 2523.2 [P.O.:700; I.V.:1773.2; IV Piggyback:50] Out: [id number] [Urine:1550] Total I/O In: -  Out: 450 [Urine:450]  FHT:  Baseline 130  moderate variability accelerations present . Decelerations variable  UC:   regular, every 3-6 minutes SVE: 5.5/80/-3  Labs: Lab Results  Component Value Date   WBC 13.5 (H) 06/22/2022   HGB 12.2 06/22/2022   HCT 35.7 (L) 06/22/2022   MCV 83.0 06/22/2022   PLT 223 06/22/2022    Assessment / Plan: Arrest in active phase of labor  Recommend cesarean section. R/B/A of cesarean section discussed with the patient including but not limited to infection, bleeding damage to bowel bladder and baby with the need for further surgery. R/O transfusion HIV/ Hep B&C discussed. Pt voiced understanding and is agreeable  to proceed with cesarean section.   Dr. Charlesetta Garibaldi assuming care at 7 am.   Christophe Louis, MD 06/23/2022, 6:38 AM

## 2022-06-23 NOTE — Progress Notes (Signed)
Pt states she is tired.   BP 137/81   Pulse 90   Temp 98.7 F (37.1 C) (Oral)   Resp 17   Ht 5' 3"$  (1.6 m)   Wt 126.7 kg   LMP 09/06/2021 (Exact Date)   SpO2 100%   BMI 49.48 kg/m  Cat 2 but reassuring Pt understands the R&B of CS Failed induction Gestational HTN Failure to dilate Will proceeed with CS

## 2022-06-23 NOTE — Plan of Care (Signed)
  Problem: Education: Goal: Knowledge of General Education information will improve Description: Including pain rating scale, medication(s)/side effects and non-pharmacologic comfort measures Outcome: Progressing   Problem: Health Behavior/Discharge Planning: Goal: Ability to manage health-related needs will improve Outcome: Progressing   Problem: Clinical Measurements: Goal: Ability to maintain clinical measurements within normal limits will improve Outcome: Progressing Goal: Will remain free from infection Outcome: Progressing Goal: Diagnostic test results will improve Outcome: Progressing Goal: Respiratory complications will improve Outcome: Progressing Goal: Cardiovascular complication will be avoided Outcome: Progressing   Problem: Activity: Goal: Risk for activity intolerance will decrease Outcome: Progressing   Problem: Nutrition: Goal: Adequate nutrition will be maintained Outcome: Progressing   Problem: Coping: Goal: Level of anxiety will decrease Outcome: Progressing   Problem: Elimination: Goal: Will not experience complications related to bowel motility Outcome: Progressing Goal: Will not experience complications related to urinary retention Outcome: Progressing   Problem: Pain Managment: Goal: General experience of comfort will improve Outcome: Progressing   Problem: Safety: Goal: Ability to remain free from injury will improve Outcome: Progressing   Problem: Skin Integrity: Goal: Risk for impaired skin integrity will decrease Outcome: Progressing   Problem: Education: Goal: Knowledge of Childbirth will improve Outcome: Progressing Goal: Ability to make informed decisions regarding treatment and plan of care will improve Outcome: Progressing Goal: Ability to state and carry out methods to decrease the pain will improve Outcome: Progressing Goal: Individualized Educational Video(s) Outcome: Progressing   Problem: Coping: Goal: Ability to  verbalize concerns and feelings about labor and delivery will improve Outcome: Progressing   Problem: Life Cycle: Goal: Ability to make normal progression through stages of labor will improve Outcome: Progressing Goal: Ability to effectively push during vaginal delivery will improve Outcome: Progressing   Problem: Role Relationship: Goal: Will demonstrate positive interactions with the child Outcome: Progressing   Problem: Safety: Goal: Risk of complications during labor and delivery will decrease Outcome: Progressing   Problem: Pain Management: Goal: Relief or control of pain from uterine contractions will improve Outcome: Progressing   Problem: Education: Goal: Knowledge of the prescribed therapeutic regimen will improve Outcome: Progressing Goal: Understanding of sexual limitations or changes related to disease process or condition will improve Outcome: Progressing Goal: Individualized Educational Video(s) Outcome: Progressing   Problem: Self-Concept: Goal: Communication of feelings regarding changes in body function or appearance will improve Outcome: Progressing   Problem: Skin Integrity: Goal: Demonstration of wound healing without infection will improve Outcome: Progressing   Problem: Education: Goal: Knowledge of condition will improve Outcome: Progressing Goal: Individualized Educational Video(s) Outcome: Progressing Goal: Individualized Newborn Educational Video(s) Outcome: Progressing   Problem: Activity: Goal: Will verbalize the importance of balancing activity with adequate rest periods Outcome: Progressing Goal: Ability to tolerate increased activity will improve Outcome: Progressing   Problem: Coping: Goal: Ability to identify and utilize available resources and services will improve Outcome: Progressing   Problem: Life Cycle: Goal: Chance of risk for complications during the postpartum period will decrease Outcome: Progressing   Problem: Role  Relationship: Goal: Ability to demonstrate positive interaction with newborn will improve Outcome: Progressing   Problem: Skin Integrity: Goal: Demonstration of wound healing without infection will improve Outcome: Progressing

## 2022-06-23 NOTE — Lactation Note (Signed)
This note was copied from a baby's chart. Lactation Consultation Note  Patient Name: Kristie Wolf [id number] Date: 06/23/2022 Reason for consult: Initial assessment;Term (C/S delivery), GHTN see Birth Parent's MR Age:30 hours  Birth Parent had infant latched on her right breast using the cradle hold infant was latched with depth, swallows observed and infant BF for 27 minutes, LC did not assist with latch. Birth Parent was taught hand expression, LC used breast model and Birth Parent easily self expressed, infant given 3 mls of colostrum by spoon, LC discussed infant's small tummy size first few days of life, Birth Parent will continue to BF infant according  to hunger cues, on demand, 8 to 12+ times within 24 hours, STS. Birth Parent knows to call [facility] for further latch assistance if needed. LC discussed infant's input and output. Importance of maternal rest, diet and hydration. Birth Parent was made  aware of O/P services, breastfeeding support groups, community resources, and our phone # for post-discharge questions.    LC discussed current infant feeding plan that may change and LC services will follow up next shift and feeding plan may be different.  Maternal Data Has patient been taught Hand Expression?: Yes  Feeding Mother's Current Feeding Choice: Breast Milk  LATCH Score Latch: Grasps breast easily, tongue down, lips flanged, rhythmical sucking.  Audible Swallowing: A few with stimulation  Type of Nipple: Everted at rest and after stimulation  Comfort (Breast/Nipple): Soft / non-tender  Hold (Positioning): No assistance needed to correctly position infant at breast.  LATCH Score: 9   Lactation Tools Discussed/Used   Birth Parent has wearable Medela DEBP. Interventions    Discharge    Consult Status      Eulis Canner 06/23/2022, 8:32 PM

## 2022-06-23 NOTE — Op Note (Signed)
Cesarean Section Procedure Note   Kristie Wolf  06/23/2022  Indications:  FAILED INDUCTION    Pre-operative Diagnosis: Unscheduled Cesarean Section, See Delivery Summary Arrest of Dialation.  GHTN FAILED INDUCTION  Post-operative Diagnosis: Same   Surgeon: Surgeon(s) and Role:    * Crawford Givens, MD - Primary   Assistants: [facility]   Anesthesia: epidural   Procedure Details:  The patient was seen in the Holding Room. The risks, benefits, complications, treatment options, and expected outcomes were discussed with the patient. The patient concurred with the proposed plan, giving informed consent. identified as Kristie Wolf and the procedure verified as C-Section Delivery. A Time Out was held and the above information confirmed.  After induction of anesthesia, the patient was draped and prepped in the usual sterile manner. A transverse incision was made and carried down through the subcutaneous tissue to the fascia. Fascial incision was made in the midline and extended transversely. The fascia was separated from the underlying rectus muscle superiorly and inferiorly. The peritoneum was identified and entered. Peritoneal incision was extended longitudinally with good visualization of bowel and bladder. The utero-vesical peritoneal reflection was incised transversely and the bladder flap was bluntly freed from the lower uterine segment.  An alexsis retractor was placed in the abdomen.   A low transverse uterine incision was made. Delivered from cephalic presentation was a  infant, with Apgar scores of 8 at one minute and 9 at five minutes. Cord ph was not sent the umbilical cord was clamped and cut cord blood was obtained for evaluation. The placenta was removed Intact and appeared normal. The uterine outline, tubes and ovaries appeared normal}. The uterine incision was closed with running locked sutures of 0Vicryl. A second layer 0 vicrlyl was used to imbricate the uterine incision     Hemostasis was observed. Lavage was carried out until clear. SURGICEL WAS PLACED ALONG THE INCISION.   The alexsis was removed.  The peritoneum was closed with 0 chromic.  The muscles were examined and any bleeders were made hemostatic using bovie cautery device.   The fascia was then reapproximated with running sutures of 0 vicryl.  The subcutaneous tissue was reapproximated.  Jp DRAIN PLACED IN THE SQ  With interrupted stitches using 2-0 plain gut. The subcuticular closure was performed using 3-30moocryl     Instrument, sponge, and needle counts were correct prior the abdominal closure and were correct at the conclusion of the case.    Findings: infant was delivered from VGreeleypresentation. The fluid was CLEAR.  The uterus tubes and ovaries appeared normal.     Estimated Blood Loss: 550CC   Total IV Fluids: 17742m  Urine Output:  200CC OF clear urine  Specimens: PLACENTA TO PATH  Complications: no complications  Disposition: PACU - hemodynamically stable.   Maternal Condition: stable   Baby condition / location:  Couplet care / Skin to Skin  Attending Attestation: I performed the procedure.   Signed: Surgeon(s): DiCrawford GivensMD

## 2022-06-23 NOTE — Transfer of Care (Signed)
Immediate Anesthesia Transfer of Care Note  Patient: Kristie Wolf  Procedure(s) Performed: CESAREAN SECTION  Patient Location: PACU  Anesthesia Type:Epidural  Level of Consciousness: awake  Airway & Oxygen Therapy: Patient Spontanous Breathing  Post-op Assessment: Report given to RN and Post -op Vital signs reviewed and stable  Post vital signs: Reviewed and stable  Last Vitals:  Vitals Value Taken Time  BP 120/85 06/23/22 1145  Temp    Pulse 102 06/23/22 1146  Resp 18 06/23/22 1146  SpO2 97 % 06/23/22 1146  Vitals shown include unvalidated device data.  Last Pain:  Vitals:   06/23/22 0823  TempSrc: Oral  PainSc:          Complications: No notable events documented.

## 2022-06-23 NOTE — Anesthesia Postprocedure Evaluation (Signed)
Anesthesia Post Note  Patient: Kristie Wolf  Procedure(s) Performed: CESAREAN SECTION     Patient location during evaluation: PACU Anesthesia Type: Epidural Level of consciousness: awake Pain management: pain level controlled Vital Signs Assessment: post-procedure vital signs reviewed and stable Respiratory status: spontaneous breathing, nonlabored ventilation and respiratory function stable Cardiovascular status: blood pressure returned to baseline and stable Postop Assessment: no apparent nausea or vomiting Anesthetic complications: no   No notable events documented.  Last Vitals:  Vitals:   06/23/22 1315 06/23/22 1415  BP: 126/79 135/85  Pulse: 75 69  Resp: 18 18  Temp: 37.5 C 36.9 C  SpO2:  99%    Last Pain:  Vitals:   06/23/22 1415  TempSrc: Oral  PainSc: 0-No pain   Pain Goal:                   Karyl Kinnier Roseland Braun

## 2022-06-24 ENCOUNTER — Encounter (HOSPITAL_COMMUNITY): Payer: Self-pay | Admitting: Obstetrics and Gynecology

## 2022-06-24 LAB — COMPREHENSIVE METABOLIC PANEL
ALT: 13 U/L (ref 0–44)
AST: 21 U/L (ref 15–41)
Albumin: 1.9 g/dL — ABNORMAL LOW (ref 3.5–5.0)
Alkaline Phosphatase: 150 U/L — ABNORMAL HIGH (ref 38–126)
Anion gap: 9 (ref 5–15)
BUN: 9 mg/dL (ref 6–20)
CO2: 21 mmol/L — ABNORMAL LOW (ref 22–32)
Calcium: 8.5 mg/dL — ABNORMAL LOW (ref 8.9–10.3)
Chloride: 105 mmol/L (ref 98–111)
Creatinine, Ser: 0.81 mg/dL (ref 0.44–1.00)
GFR, Estimated: >60 mL/min (ref >60)
Glucose, Bld: 93 mg/dL (ref 70–99)
Potassium: 3.7 mmol/L (ref 3.5–5.1)
Sodium: 135 mmol/L (ref 135–145)
Total Bilirubin: 0.6 mg/dL (ref 0.3–1.2)
Total Protein: 5.5 g/dL — ABNORMAL LOW (ref 6.5–8.1)

## 2022-06-24 LAB — CBC
HCT: 30.6 % — ABNORMAL LOW (ref 36.0–46.0)
Hemoglobin: 10.1 g/dL — ABNORMAL LOW (ref 12.0–15.0)
MCH: 28.2 pg (ref 26.0–34.0)
MCHC: 33 g/dL (ref 30.0–36.0)
MCV: 85.5 fL (ref 80.0–100.0)
Platelets: 208 10*3/uL (ref 150–400)
RBC: 3.58 MIL/uL — ABNORMAL LOW (ref 3.87–5.11)
RDW: 13.7 % (ref 11.5–15.5)
WBC: 14.3 10*3/uL — ABNORMAL HIGH (ref 4.0–10.5)
nRBC: 0 % (ref 0.0–0.2)

## 2022-06-24 NOTE — Clinical Social Work Maternal (Incomplete)
CLINICAL SOCIAL WORK MATERNAL/CHILD NOTE  Patient Details  Name: Kristie Wolf MRN: [id number] Date of Birth: [birth year]  Date:  06/24/2022  Clinical Social Worker Initiating Note:    Date/Time: Initiated:  06/24/2022     Child's Name:  Kristie Wolf   Biological Parents:  Kristie Wolf   Need for Interpreter:  No   Reason for Referral:      Address:  [address] [place] [address]    Phone number:  [phone] (home)     Additional phone number:   Household Members/Support Persons (HM/SP):   MOB's sister   HM/SP Name Relationship DOB or Age  HM/SP -1       HM/SP -2        HM/SP -3        HM/SP -4        HM/SP -5        HM/SP -6        HM/SP -7        HM/SP -8          Natural Supports (not living in the home):  Mother   Professional Supports: None   Employment: Part Time   Type of Work: [place] [employer]   Education:  Haematologist Psychology   Homebound arranged:   Museum/gallery curator Resources:      Other Resources:  Holiday( Per MOB does not qualify for food stamps)   Cultural/Religious Considerations Which May Impact Care:    Strengths:      Psychotropic Medications:         Pediatrician:    Dr. Bette[facility]   Pediatrician List:   [facility]      Pediatrician Fax Number:    Risk Factors/Current Problems:  Substance Use   Cognitive State:  Alert, Insightful   Mood/Affect:  Agitated, Comfortable  CSW Assessment: CSW was consulted for history of PTSD Abuse and Edinburgh score 10. CSW entered the room and met MOB at bedside to complete full psychosocial assessment. MOB was sitting on the couch with infant safely sleeping in the bassinet. CSW asked MOB can you provide verbal permission to speak about anything while her guest are present. MOB was agreeable. CSW introduced herself and explained the reason for the visit. MOB presented  welcoming. CSW expressed her congratulations to MOB about being a first time Mom. CSW asked MOB who all resides in her home. MOB reported that she lives with her sister. MOB reported her supports as her sister and mother.   CSW asked MOB about her mental health history. MOB reported that she suffers with PTSD and anxiety however the signs and symptoms are undiagnosed. CSW asked MOB about her Edinburgh score 10. MOB reported that she is hard on herself otherwise no signs. CSW educated MOB about PPD. CSW informed MOB of possible supports and interventions to decrease PPD.  CSW also encouraged MOB to seek medical attention if needed for increased signs and symptoms for PPD. CSW assessed for safety with MOB SI/HI/DV. MOB denied SI/HI/DV  CSW asked MOB had she chosen a pediatrician for infant. MOB reported Dr.Preeya Cleckley at [facility] in [place], [place]. MOB reported having all needed items for baby including a car seat and a 3 n1 pack and play. CSW educated MOB about SIDS.  CSW asked MOB about her substance abuse history. MOB reported that she  used THC prior to being pregnant.CSW educated MOB with the history of THC use, the hospital will perform drug screenings on infant; if the CDS and UDS return with positive results a report to CPS will be made. MOB was understanding.     CSW Plan/Description:       Roni Bread, LCSW 06/24/2022, 1:24 PM

## 2022-06-24 NOTE — Progress Notes (Signed)
Subjective: POD# 1 Information for the patient's newborn:  Keydra, Osorno [id number]  female   32 Name Kiyan Circumcision Completed  Reports feeling very good Feeding: breast Reports tolerating PO and denies N/V, foley removed, ambulating and urinating w/o difficulty  Pain controlled with  PO meds Denies HA/SOB/dizziness  Flatus passing Vaginal bleeding is normal, no clots     Objective:  VS:  Vitals:   06/23/22 2037 06/24/22 0119 06/24/22 0541 06/24/22 1258  BP:  134/75 104/65 106/71  Pulse:  94 76 89  Resp: '19 20 20 18  '$ Temp: 98.2 F (36.8 C) 98 F (36.7 C) 97.6 F (36.4 C) 97.8 F (36.6 C)  TempSrc: Oral Oral Oral Oral  SpO2: 97% 98% 96% 99%  Weight:      Height:        Intake/Output Summary (Last 24 hours) at 06/24/2022 1603 Last data filed at 06/24/2022 1149 Gross per 24 hour  Intake 2033.3 ml  Output 2350 ml  Net -316.7 ml     Recent Labs    06/23/22 0720 06/24/22 0454  WBC 15.9* 14.3*  HGB 12.5 10.1*  HCT 37.6 30.6*  PLT 233 208    Blood type: --/--/B POS (02/20 0740) Rubella: Immune (08/11 0000)    Physical Exam:  General: alert, cooperative, and no distress CV: Regular rate and rhythm Resp: clear Abdomen: soft, nontender, normal bowel sounds Incision: clean, dry, and intact Perineum:  Uterine Fundus: firm, below umbilicus, nontender Lochia: minimal Ext:  trace edema, neg for pain, tenderness, and cords   Assessment/Plan: 32 y.o.   POD# 1. G1P1001                  Principal Problem:   Labor and delivery, indication for care Active Problems:   Maternal obesity affecting pregnancy, antepartum   Routine post-op PP care          Advance diet as tolerated Advised warm fluids and ambulation to improve GI motility Breastfeeding support Anticipate D/C 06/25/22   Arrie Eastern, DNP, CNM 06/24/2022, 4:03 PM

## 2022-06-24 NOTE — Clinical Social Work Maternal (Signed)
CLINICAL SOCIAL WORK MATERNAL/CHILD NOTE  Patient Details  Name: Kristie Wolf MRN: [id number] Date of Birth: 1991/03/23  Date:  06/24/2022  Clinical Social Worker Initiating Note:  Laurey Arrow Date/Time: Initiated:  06/24/22/1333     Child's Name:  Kristie Wolf   Biological Parents:  Mother (MOB declined to provide any information about FOB.)   Need for Interpreter:  None   Reason for Referral:  Behavioral Health Concerns, Current Substance Use/Substance Use During Pregnancy     Address:  [place] [address]    Phone number:  [phone] (home)     Additional phone number:   Household Members/Support Persons (HM/SP):       HM/SP Name Relationship DOB or Age  HM/SP -1        HM/SP -2        HM/SP -3        HM/SP -4        HM/SP -5        HM/SP -6        HM/SP -7        HM/SP -8          Natural Supports (not living in the home):  Immediate Family   Professional Supports: None   Employment: Part-time   Type of Work: [place] [employer]   Education:  [place] arranged:    Museum/gallery curator Resources:  Medicaid   Other Resources:  [place] (Per MOB, MOB does not qualify for [employer].)   Cultural/Religious Considerations Which May Impact Care:  Per [employer] Face Sheet, MOB Non-Denominational.   Strengths:  Home prepared for child  , Pediatrician chosen, Ability to meet basic needs     Psychotropic Medications:         Pediatrician:    [facility] area  Pediatrician List:   [facility] ([facility] (Dr. Ronnald Ramp))  [place]      Pediatrician Fax Number:    Risk Factors/Current Problems:  Substance Use  , Mental Health Concerns     Cognitive State:  Able to Concentrate  , Alert  , Goal Oriented  , Insightful  , Linear Thinking     Mood/Affect:  Agitated  , Comfortable  , Relaxed     CSW Assessment: CSW met with MOB in room 305 to  complete an assessment for hx of SA, MH hx, and Edinburgh Score 10.  When CSW arrived, MOB resting on the couch while engaging with infant while infant was in his bassinet. MOB's sister was also present but was in the rest room during the entire assessment (MOB's sister did not engage with CSW). MOB gave CSW permission to meet with MOB while MOB's sister was present. MOB was polite, forthcoming, and receptive to meeting with CSW.   CSW inquired about MOB's MH hx.  MOB reported a hx of PTSD and report symptoms are irregular. CSW reviewed MOB's Edinburgh Score.  CSW assessed for safety and MOB denied SI,HI, and DV.  CSW provided education regarding Baby Blues vs PMADs. CSW encouraged MOB to evaluate her mental health throughout the postpartum period with the use of the New Mom Checklist developed by [facility] and notify a medical professional if symptoms arise.  CSW offered MOB resources for MH interventions and MOB was receptive.  MOB did not present with any acute MH signs or symptoms.  CSW asked about MOB's SA hx. MOB openly shared the use of marijuana prior to pregnancy confirmation.  CSW informed MOB's of hospital's SA policy and MOB was understanding.  MOB denied the use of all illicit substances.  MOB is aware that CSW will monitor infant's CDS (infant's UDS was not collected) and CSW will make a report to [place] if warranted.   Per MOB, she has all essential items to care for infant post discharge.  There are no barriers to discharge.   CSW Plan/Description:  No Further Intervention Required/No Barriers to Discharge, Sudden Infant Death Syndrome (SIDS) Education, Perinatal Mood and Anxiety Disorder (PMADs) Education, Other Patient/Family Education, [place], Other Information/Referral to [employer], CSW Will Continue to Monitor Umbilical Cord Tissue Drug Screen Results and Make Report if Warranted   Laurey Arrow, MSW,  LCSW Clinical Social Work [phone]  Dimple Nanas, LCSW 06/24/2022, 1:38 PM

## 2022-06-24 NOTE — Lactation Note (Signed)
This note was copied from a baby's chart. Lactation Consultation Note  Patient Name: Kristie Wolf [id number] Date: 06/24/2022 Reason for consult: Follow-up assessment;Mother's request;Primapara;1st time breastfeeding;Breastfeeding assistance;Infant weight loss;Term (3.59% WL) Age:30 hours  LC entered the room and the birth parent was holding the infant.  Per the birth parent, the infant's feedings have been shorter today.  The birth parent stated that the infant has been cluster feeding, but he seems to be sleepy.  The birth parent said that the infant sucks on his hands a lot.  LC reassured the birth parent and spoke with her about feeding cues.  LC assisted the birth parent with putting the infant to the breast in the cross-cradle position on the right breast.  The infant latched with his tongue down, lips were flanged, some rhythmic sucking was noted with stimulation, and jaw extensions were noted.  The infant fed for about 10 min on the right breast.  The infant latched to the left breast in the football position.  Sucking was more rhythmic on the left breast with stimulation.  The infant fed for 5 min while the LC was in the room and was still sucking when the [place] left.  The birth parent was given coconut oil for nipple soreness.  The birth parent knows to look at the infant's output & the infant behavior when he comes off the breast to determine if he is satisfied at the breast.  The birth parent is aware to offer both breasts at every feeding and she knows that the infant should be actively sucking for at least 15 min.  The birth parent will supplement if there are concerns about continued short feedings.  All questions were answered.    Feeding Mother's Current Feeding Choice: Breast Milk  LATCH Score Latch: Grasps breast easily, tongue down, lips flanged, rhythmical sucking.  Audible Swallowing: A few with stimulation  Type of Nipple: Everted at rest and after  stimulation  Comfort (Breast/Nipple): Soft / non-tender  Hold (Positioning): Assistance needed to correctly position infant at breast and maintain latch.  LATCH Score: 8   Lactation Tools Discussed/Used    Interventions Interventions: Breast feeding basics reviewed;Assisted with latch;Adjust position;Support pillows  Discharge    Consult Status Consult Status: Follow-up Date: 06/25/22 Follow-up type: In-patient    Lysbeth Penner 06/24/2022, 6:53 PM

## 2022-06-24 NOTE — Plan of Care (Signed)
  Problem: Education: Goal: Knowledge of General Education information will improve Description: Including pain rating scale, medication(s)/side effects and non-pharmacologic comfort measures Outcome: Progressing   Problem: Health Behavior/Discharge Planning: Goal: Ability to manage health-related needs will improve Outcome: Progressing   Problem: Clinical Measurements: Goal: Ability to maintain clinical measurements within normal limits will improve Outcome: Progressing Goal: Will remain free from infection Outcome: Progressing Goal: Diagnostic test results will improve Outcome: Progressing Goal: Respiratory complications will improve Outcome: Progressing Goal: Cardiovascular complication will be avoided Outcome: Progressing   Problem: Activity: Goal: Risk for activity intolerance will decrease Outcome: Progressing   Problem: Nutrition: Goal: Adequate nutrition will be maintained Outcome: Progressing   Problem: Coping: Goal: Level of anxiety will decrease Outcome: Progressing   Problem: Elimination: Goal: Will not experience complications related to bowel motility Outcome: Progressing Goal: Will not experience complications related to urinary retention Outcome: Progressing   Problem: Pain Managment: Goal: General experience of comfort will improve Outcome: Progressing   Problem: Safety: Goal: Ability to remain free from injury will improve Outcome: Progressing   Problem: Skin Integrity: Goal: Risk for impaired skin integrity will decrease Outcome: Progressing   Problem: Education: Goal: Knowledge of Childbirth will improve Outcome: Progressing Goal: Ability to make informed decisions regarding treatment and plan of care will improve Outcome: Progressing Goal: Ability to state and carry out methods to decrease the pain will improve Outcome: Progressing Goal: Individualized Educational Video(s) Outcome: Progressing   Problem: Coping: Goal: Ability to  verbalize concerns and feelings about labor and delivery will improve Outcome: Progressing   Problem: Life Cycle: Goal: Ability to make normal progression through stages of labor will improve Outcome: Progressing Goal: Ability to effectively push during vaginal delivery will improve Outcome: Progressing   Problem: Role Relationship: Goal: Will demonstrate positive interactions with the child Outcome: Progressing   Problem: Safety: Goal: Risk of complications during labor and delivery will decrease Outcome: Progressing   Problem: Pain Management: Goal: Relief or control of pain from uterine contractions will improve Outcome: Progressing   Problem: Education: Goal: Knowledge of the prescribed therapeutic regimen will improve Outcome: Progressing Goal: Understanding of sexual limitations or changes related to disease process or condition will improve Outcome: Progressing Goal: Individualized Educational Video(s) Outcome: Progressing   Problem: Self-Concept: Goal: Communication of feelings regarding changes in body function or appearance will improve Outcome: Progressing   Problem: Skin Integrity: Goal: Demonstration of wound healing without infection will improve Outcome: Progressing   Problem: Education: Goal: Knowledge of condition will improve Outcome: Progressing Goal: Individualized Educational Video(s) Outcome: Progressing Goal: Individualized Newborn Educational Video(s) Outcome: Progressing   Problem: Activity: Goal: Will verbalize the importance of balancing activity with adequate rest periods Outcome: Progressing Goal: Ability to tolerate increased activity will improve Outcome: Progressing   Problem: Coping: Goal: Ability to identify and utilize available resources and services will improve Outcome: Progressing   Problem: Life Cycle: Goal: Chance of risk for complications during the postpartum period will decrease Outcome: Progressing   Problem: Role  Relationship: Goal: Ability to demonstrate positive interaction with newborn will improve Outcome: Progressing   Problem: Skin Integrity: Goal: Demonstration of wound healing without infection will improve Outcome: Progressing   

## 2022-06-25 DIAGNOSIS — D62 Acute posthemorrhagic anemia: Secondary | ICD-10-CM | POA: Diagnosis not present

## 2022-06-25 DIAGNOSIS — Z98891 History of uterine scar from previous surgery: Secondary | ICD-10-CM

## 2022-06-25 MED ORDER — ACETAMINOPHEN 500 MG PO TABS: 1000.0000 mg | ORAL_TABLET | Freq: Four times a day (QID) | ORAL | 0 refills | Status: DC

## 2022-06-25 MED ORDER — IBUPROFEN 800 MG PO TABS: 800.0000 mg | ORAL_TABLET | Freq: Three times a day (TID) | ORAL | 0 refills | Status: DC | PRN

## 2022-06-25 MED ORDER — OXYCODONE-ACETAMINOPHEN 5-325 MG PO TABS: 1.0000 | ORAL_TABLET | Freq: Four times a day (QID) | ORAL | 0 refills | Status: AC | PRN

## 2022-06-25 NOTE — Discharge Summary (Signed)
Postpartum Discharge Summary  Date of Service updated 06/25/22     Patient Name: Kristie Wolf DOB: Apr 01, 1991 MRN: [id number]  Date of admission: 06/21/2022 Delivery date:06/23/2022  Delivering provider: Crawford Givens  Date of discharge: 06/25/2022  Admitting diagnosis: Labor and delivery, indication for care [O75.9] Maternal obesity affecting pregnancy, antepartum [O99.210] Intrauterine pregnancy: [redacted]w[redacted]d    Secondary diagnosis:  Principal Problem:   Labor and delivery, indication for care Active Problems:   Maternal obesity affecting pregnancy, antepartum  Additional problems: acute blood loss anemia    Discharge diagnosis: Term Pregnancy Delivered and Anemia                                              Post partum procedures: none Augmentation: AROM, Pitocin, Cytotec, and IP Foley Complications: None  Hospital course: Induction of Labor With Cesarean Section   32y.o. yo G1P1001 at 48w3das admitted to the hospital 06/21/2022 for induction of labor. Patient had a labor course significant for protracted latent phase resulting in arrest of dilation. The patient went for cesarean section due to Arrest of Dilation. Delivery details are as follows: Membrane Rupture Time/Date: 1:24 PM ,06/22/2022   Delivery Method:C-Section, Vacuum Assisted  Details of operation can be found in separate operative Note.  Patient had a postpartum course was uncomplicated. JP drain with 20 mL serous fluid, drain removed without difficulty on POD 2 (06/26/22) and pressure dressing re-applied x24 hrs. She is ambulating, tolerating a regular diet, passing flatus, and urinating well.  Patient is discharged home in stable condition on 06/25/22.      Newborn Data: Birth date:06/23/2022  Birth time:10:48 AM  Gender:Female  Living status:Living  Apgars:8 ,9  Weight:3200 g                                Magnesium Sulfate received: No BMZ received: No Rhophylac:N/A MMR:N/A Transfusion:No  Physical exam   Vitals:   06/24/22 0541 06/24/22 1258 06/24/22 2058 06/25/22 0450  BP: 104/65 106/71 122/69 118/74  Pulse: 76 89 82 77  Resp: '20 18 18 18  '$ Temp: 97.6 F (36.4 C) 97.8 F (36.6 C) 98.4 F (36.9 C) 97.8 F (36.6 C)  TempSrc: Oral Oral Oral Oral  SpO2: 96% 99% 99% 98%  Weight:      Height:       General: alert, cooperative, and no distress Lochia: appropriate Uterine Fundus: firm Incision: Dressing is clean, dry, and intact DVT Evaluation: No evidence of DVT seen on physical exam. No cords or calf tenderness. No significant calf/ankle edema. Labs: Lab Results  Component Value Date   WBC 14.3 (H) 06/24/2022   HGB 10.1 (L) 06/24/2022   HCT 30.6 (L) 06/24/2022   MCV 85.5 06/24/2022   PLT 208 06/24/2022      Latest Ref Rng & Units 06/24/2022    4:54 AM  CMP  Glucose 70 - 99 mg/dL 93   BUN 6 - 20 mg/dL 9   Creatinine 0.44 - 1.00 mg/dL 0.81   Sodium 135 - 145 mmol/L 135   Potassium 3.5 - 5.1 mmol/L 3.7   Chloride 98 - 111 mmol/L 105   CO2 22 - 32 mmol/L 21   Calcium 8.9 - 10.3 mg/dL 8.5   Total Protein 6.5 - 8.1 g/dL 5.5  Total Bilirubin 0.3 - 1.2 mg/dL 0.6   Alkaline Phos 38 - 126 U/L 150   AST 15 - 41 U/L 21   ALT 0 - 44 U/L 13    Edinburgh Score:    06/23/2022    3:19 PM  Edinburgh Postnatal Depression Scale Screening Tool  I have been able to laugh and see the funny side of things. 0  I have looked forward with enjoyment to things. 0  I have blamed myself unnecessarily when things went wrong. 3  I have been anxious or worried for no good reason. 2  I have felt scared or panicky for no good reason. 1  Things have been getting on top of me. 2  I have been so unhappy that I have had difficulty sleeping. 0  I have felt sad or miserable. 1  I have been so unhappy that I have been crying. 1  The thought of harming myself has occurred to me. 0  Edinburgh Postnatal Depression Scale Total 10      After visit meds:  Allergies as of 06/25/2022   No Known  Allergies      Medication List     TAKE these medications    acetaminophen 500 MG tablet Commonly known as: TYLENOL Take 2 tablets (1,000 mg total) by mouth every 6 (six) hours.   ibuprofen 800 MG tablet Commonly known as: ADVIL Take 1 tablet (800 mg total) by mouth every 8 (eight) hours as needed.   oxyCODONE-acetaminophen 5-325 MG tablet Commonly known as: PERCOCET/ROXICET Take 1 tablet by mouth every 6 (six) hours as needed for up to 5 days for moderate pain or severe pain.   PRENATAL GUMMIES PO Take by mouth.   valACYclovir 500 MG tablet Commonly known as: VALTREX Take 500 mg by mouth daily.   VITAMIN D PO Take by mouth.               Discharge Care Instructions  (From admission, onward)           Start     Ordered   06/25/22 0000  Discharge wound care:       Comments: Take pressure dressing off on Sunday evening 06/26/22, remove it sooner if it is dirty or damaged. Clean area with soap and water and pat dry. You can leave the steri strips on until they fall off or take them off gently by 06/29/22. Call the office for increased drainage, redness, pain, or warmth. Keep the incision area clean and dry at all times.   06/25/22 0811             Discharge home in stable condition Infant Feeding: Bottle and Breast Infant Disposition:home with mother Discharge instruction: per After Visit Summary and Postpartum booklet. Activity: Advance as tolerated. Pelvic rest for 6 weeks.  Diet: low salt diet Anticipated Birth Control: OCPs Postpartum Appointment:6 weeks Additional Postpartum F/U:  none Future Appointments:No future appointments. Follow up Visit:  Follow-up Information     Crawford Givens, MD Follow up.   Specialty: Obstetrics and Gynecology Contact information: [address] [place] [address] [phone]                     06/25/2022 Arrie Eastern, CNM

## 2022-06-25 NOTE — Lactation Note (Signed)
This note was copied from a baby's chart. Lactation Consultation Note  Patient Name: Kristie Wolf [id number] Date: 06/25/2022 Reason for consult: Follow-up assessment;Primapara;1st time breastfeeding;Infant weight loss;Breastfeeding assistance (6.41% WL) Age:30 hours  LC entered the room and the infant was asleep in the bassinet.  Per the birth parent the infant did a lot of cluster feeding last night and went to the nursery where they gave him formula.  The birth parent stated that her plan is to put the infant to the breast when she returns home, pump, and give the infant some formula.  LC spoke with the birth parent about pumping and praised her for having a plan for feeding her infant.  The birth parent will call the [facility] when she is ready to feed the infant to speak about any questions that she has and to see a latch.   Feeding Nipple Type: Slow - flow  Interventions Interventions: Education  Discharge    Consult Status Consult Status: Follow-up Date: 06/25/22 Follow-up type: In-patient    Lysbeth Penner 06/25/2022, 9:53 AM

## 2022-06-28 LAB — SURGICAL PATHOLOGY

## 2022-07-02 ENCOUNTER — Telehealth (HOSPITAL_COMMUNITY): Payer: Self-pay | Admitting: *Deleted

## 2022-07-02 NOTE — Telephone Encounter (Signed)
Attempted hospital discharge follow-up call. Left message for patient to return RN call with any questions or concerns. Hospital EPDS=10 on 06/23/22. Score faxed to Dr. Crawford Givens. Erline Levine, RN, 07/02/22, [phone]

## 2022-07-03 ENCOUNTER — Telehealth (HOSPITAL_COMMUNITY): Payer: Self-pay | Admitting: Lactation Services

## 2022-07-03 NOTE — Telephone Encounter (Signed)
Mother called our office and requesting to have a lactation appointment for breastfeeding assistance. Baby is 64 days old, infant formula feeding, started in the hospital. Mother is latching baby and pumping to increase milk supply. Baby feeding for 10 minutes several times per day. Mother pumping 1.5-2 oz of expressed milk. Pumping every 2-3 hours. Wants help latching.  Referral made to [facility] OP LC, Nonah Mattes for lactation appointment. Informed mother of phone number to call if she does hear from [facility]. Discussed [place] breastfeeding support groups.

## 2022-08-08 ENCOUNTER — Telehealth (HOSPITAL_COMMUNITY): Payer: Self-pay | Admitting: Lactation Services

## 2022-08-22 NOTE — Telephone Encounter (Signed)
na

## 2022-12-02 ENCOUNTER — Encounter (HOSPITAL_COMMUNITY): Payer: Self-pay

## 2022-12-02 ENCOUNTER — Ambulatory Visit (INDEPENDENT_AMBULATORY_CARE_PROVIDER_SITE_OTHER): Payer: Medicaid Other

## 2022-12-02 ENCOUNTER — Ambulatory Visit (HOSPITAL_COMMUNITY)
Admission: EM | Admit: 2022-12-02 | Discharge: 2022-12-02 | Disposition: A | Discharge status: Home / Self Care | Payer: Medicaid Other | Attending: Emergency Medicine | Admitting: Emergency Medicine

## 2022-12-02 DIAGNOSIS — M25471 Effusion, right ankle: Secondary | ICD-10-CM

## 2022-12-02 DIAGNOSIS — M25571 Pain in right ankle and joints of right foot: Secondary | ICD-10-CM | POA: Diagnosis not present

## 2022-12-02 MED ORDER — IBUPROFEN 800 MG PO TABS
ORAL_TABLET | ORAL | Status: AC
  Filled 2022-12-02: qty 1

## 2022-12-02 MED ORDER — NAPROXEN 500 MG PO TABS: 500.0000 mg | ORAL_TABLET | Freq: Two times a day (BID) | ORAL | 0 refills | Status: AC

## 2022-12-02 MED ORDER — IBUPROFEN 800 MG PO TABS
800.0000 mg | ORAL_TABLET | Freq: Once | ORAL | Status: AC
  Administered 2022-12-02: 800 mg via ORAL

## 2022-12-02 NOTE — Discharge Instructions (Addendum)
Please rest, ice, compress and elevate your ankle.  Wearing the brace will help provide stability as well as compression.  Please take the anti-inflammatory as scheduled.  If your pain persist, you can follow-up with an orthopedic.    Return to clinic for new or urgent symptoms.
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
## 2022-12-02 NOTE — ED Triage Notes (Signed)
Patient c/o right ankle pain and swelling since yesterday. Patient denies any injury. Patient states pain is worse with weight bearing.  Patient denies taking any medication for her symptoms.

## 2022-12-19 IMAGING — RF DG ESOPHAGUS
9 series · 14 of 24 positions shown · non-contrast
Comparison: None.

CLINICAL DATA: Patient reports difficulty swallowing saliva. She
reports no difficulty swallowing fluids or solid food.

EXAM:
ESOPHOGRAM / BARIUM SWALLOW / BARIUM TABLET STUDY
TECHNIQUE: Combined double contrast and single contrast examination performed
using effervescent crystals, thick barium liquid, and thin barium
liquid. The patient was observed with fluoroscopy swallowing a 13 mm
barium sulphate tablet.
FLUOROSCOPY TIME:  Fluoroscopy Time:  1 minute 24 seconds
Radiation Exposure Index (if provided by the fluoroscopic device):
11.4 mGy
Number of Acquired Spot Images: 0

[Series 1: sequence · 2 of 16 frames shown (1 of 7)]
[frame 3/16]
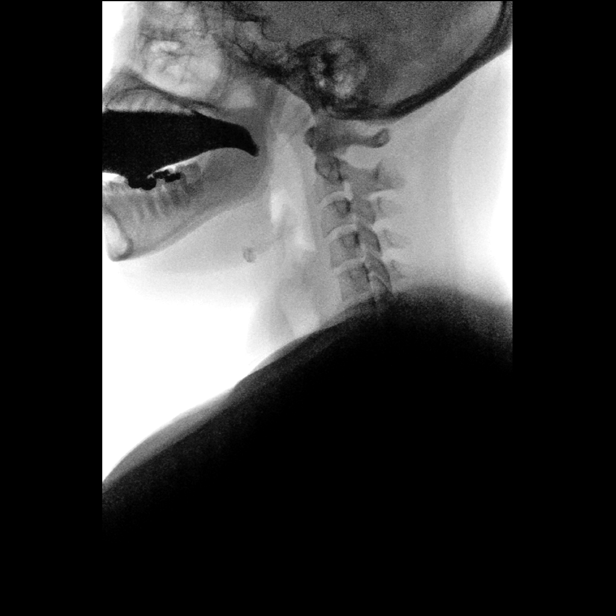
[frame 14/16]
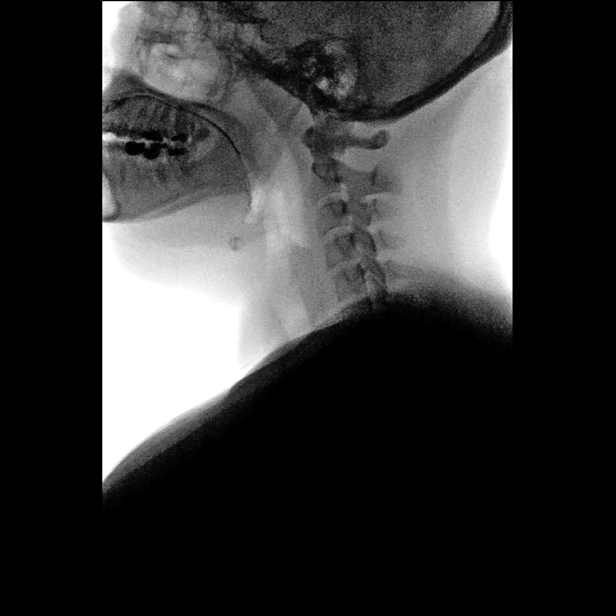

[Series 2: sequence · 1 of 19 frames shown (2 of 7)]
[frame 3/19]
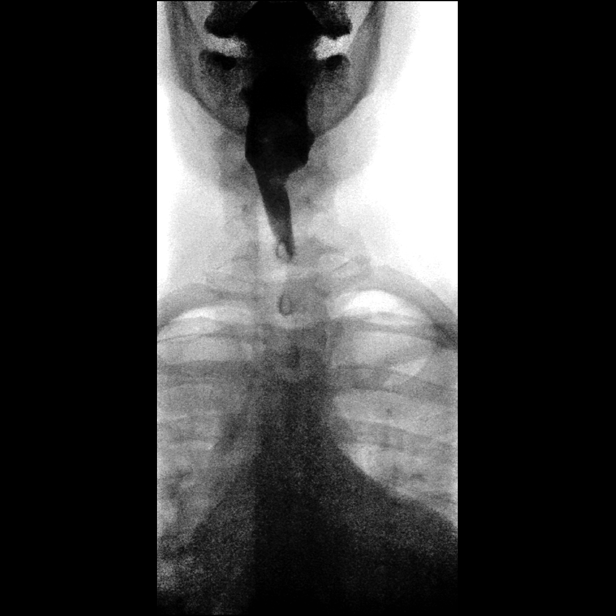

[Series 3: sequence · 3 of 27 frames shown (3 of 7)]
[frame 1/27]
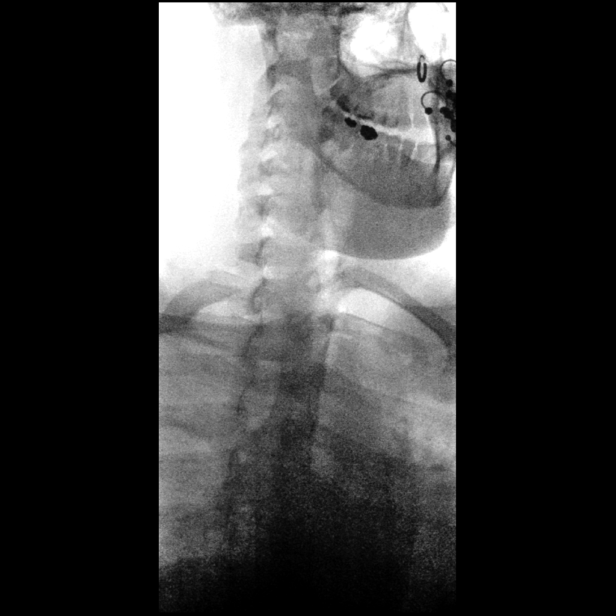
[frame 5/27]
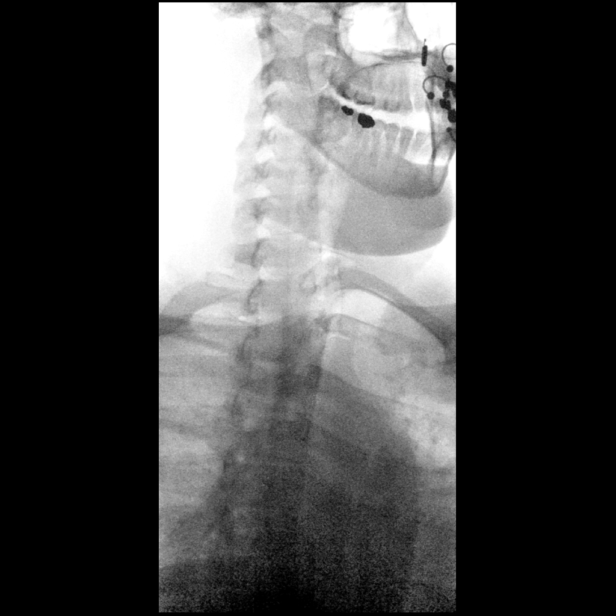
[frame 23/27]
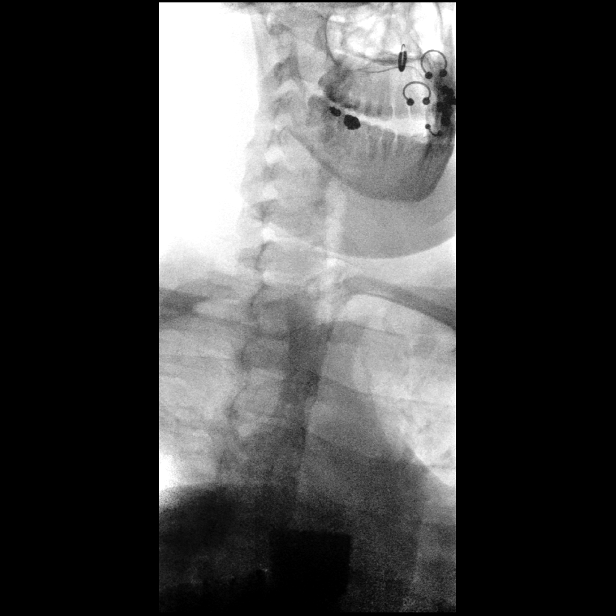

[Series 4: sequence · 2 of 43 frames shown (4 of 7)]
[frame 22/43]
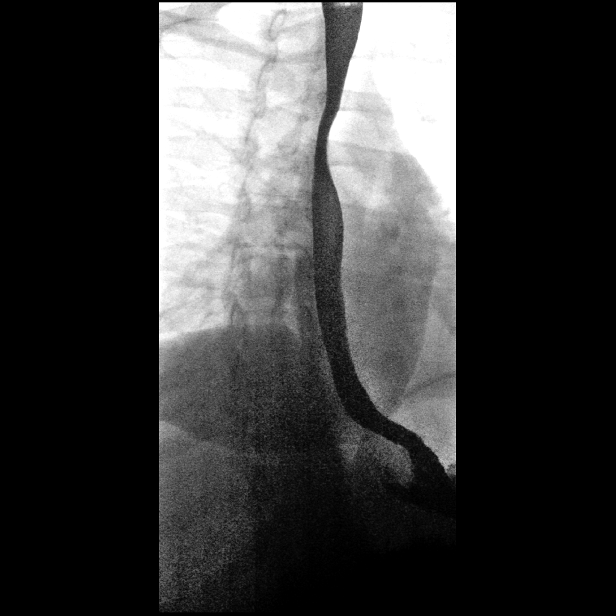
[frame 37/43]
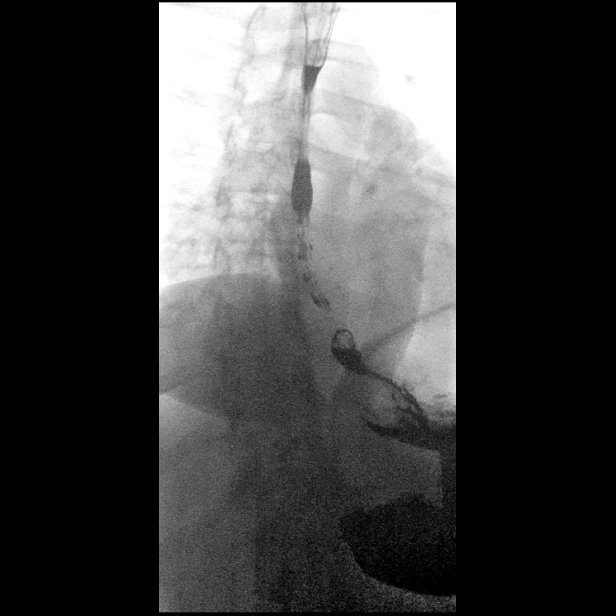

[Series 5: sequence · 1 of 40 frames shown (5 of 7)]
[frame 28/40]
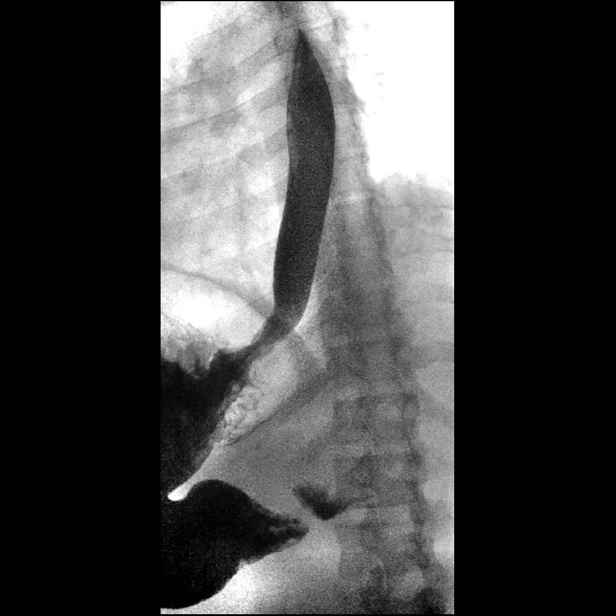

[Series 6: sequence · 2 of 42 frames shown (6 of 7)]
[frame 7/42]
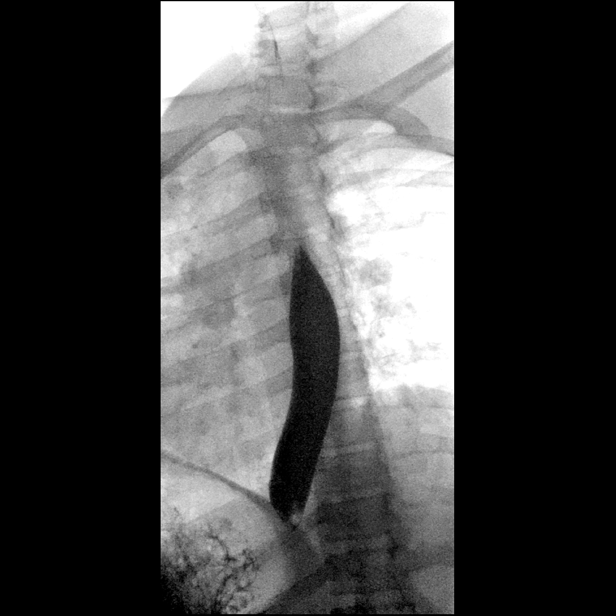
[frame 36/42]
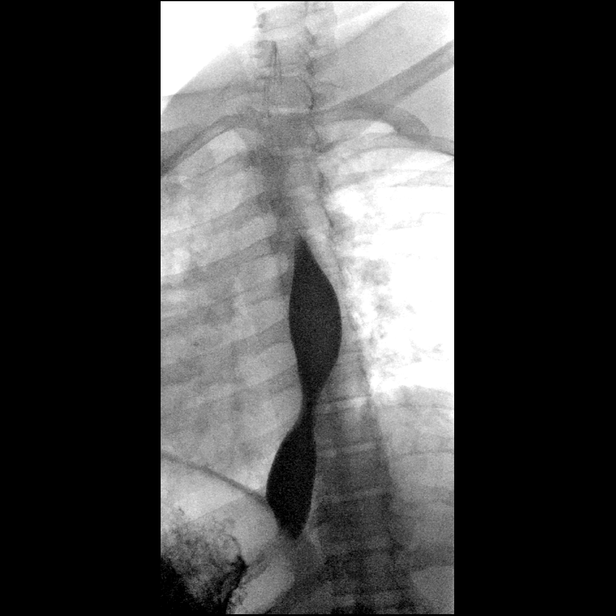

[Series 7: one shot · 1 of 1 slices shown (1 of 2)]
[im 1/1]
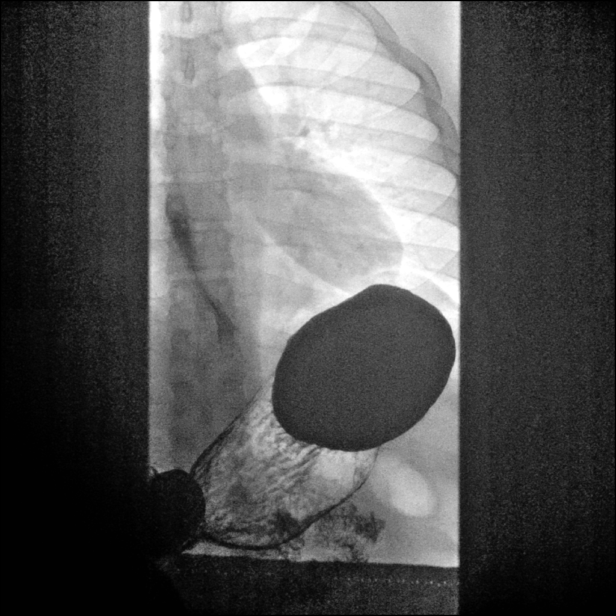

[Series 8: sequence · 1 of 24 frames shown (7 of 7)]
[frame 13/24]
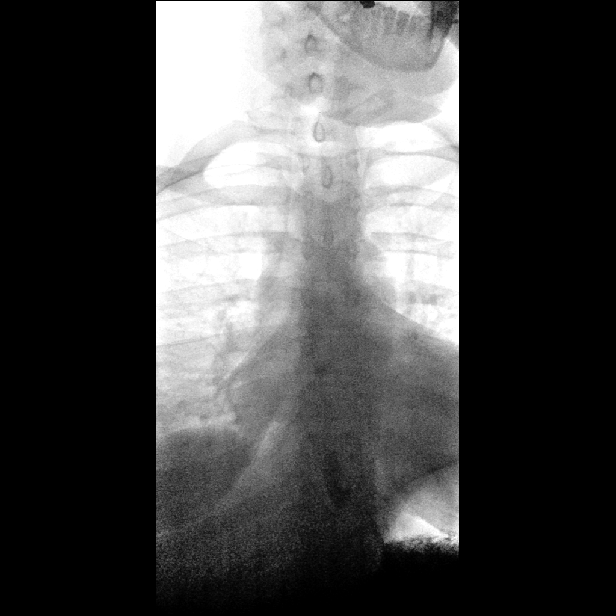

[Series 9: one shot · 1 of 1 slices shown (2 of 2)]
[im 1/1]
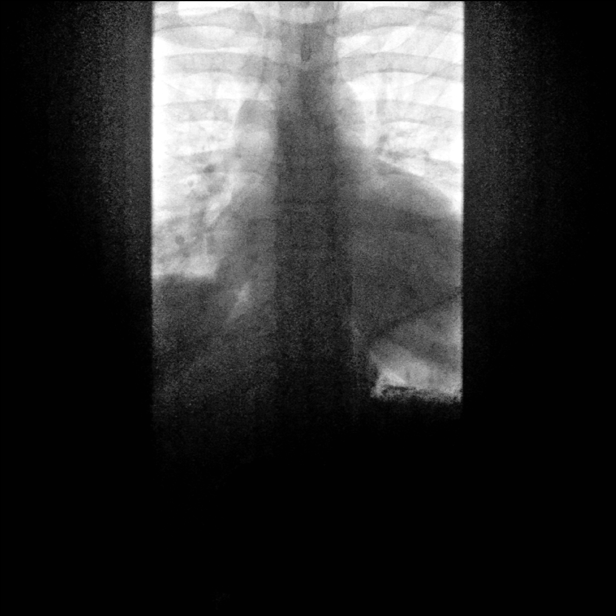

[14 of 24 positions shown; findings below may reference images not displayed]

FINDINGS: There is normal esophageal motility. Mucosal appearance and caliber
of the esophagus are normal.

Gastroesophageal junction opens widely and has a normal appearance.
Barium tablet is passed without difficulty into the stomach. No
gastroesophageal reflux noted during the exam.

Evaluation of the cervical esophagus is unremarkable.
IMPRESSION: Normal exam.

## 2024-05-15 ENCOUNTER — Other Ambulatory Visit: Payer: Self-pay

## 2024-05-15 ENCOUNTER — Encounter (HOSPITAL_COMMUNITY): Payer: Self-pay | Admitting: Emergency Medicine

## 2024-05-15 ENCOUNTER — Ambulatory Visit (HOSPITAL_COMMUNITY)
Admission: EM | Admit: 2024-05-15 | Discharge: 2024-05-15 | Disposition: A | Discharge status: Home / Self Care | Payer: Self-pay | Attending: Family Medicine | Admitting: Family Medicine

## 2024-05-15 DIAGNOSIS — R197 Diarrhea, unspecified: Secondary | ICD-10-CM

## 2024-05-15 DIAGNOSIS — Z3202 Encounter for pregnancy test, result negative: Secondary | ICD-10-CM

## 2024-05-15 DIAGNOSIS — R531 Weakness: Secondary | ICD-10-CM

## 2024-05-15 LAB — POCT URINE DIPSTICK
Bilirubin, UA: NEGATIVE
Blood, UA: NEGATIVE
Glucose, UA: NEGATIVE mg/dL
Leukocytes, UA: NEGATIVE
Nitrite, UA: NEGATIVE
POC PROTEIN,UA: NEGATIVE
Spec Grav, UA: 1.025
Urobilinogen, UA: 0.2 U/dL
pH, UA: 6

## 2024-05-15 LAB — POC SOFIA SARS ANTIGEN FIA: SARS Coronavirus 2 Ag: NEGATIVE

## 2024-05-15 LAB — POCT INFLUENZA A/B
Influenza A, POC: NEGATIVE
Influenza B, POC: NEGATIVE

## 2024-05-15 LAB — POCT URINE PREGNANCY: Preg Test, Ur: NEGATIVE

## 2024-05-15 MED ORDER — ONDANSETRON 4 MG PO TBDP: 4.0000 mg | ORAL_TABLET | Freq: Three times a day (TID) | ORAL | 0 refills | Status: AC | PRN

## 2024-05-15 NOTE — ED Triage Notes (Signed)
 Symptoms started yesterday.  Complains of diarrhea, chills, no fever, weakness and vomited.  Vomited once today.

## 2024-05-15 NOTE — ED Provider Notes (Signed)
 " [facility]   [id number] 05/15/24 Arrival Time: 0909  ASSESSMENT & PLAN:  1. Weakness   2. Diarrhea, unspecified type    Tolerating PO fluid intake. Suspect GI virus. Diarrhea has eased a little this morning.  Results for orders placed or performed during the hospital encounter of 05/15/24  POC Urinalysis Dipstick   Collection Time: 05/15/24 10:35 AM  Result Value Ref Range   Color, UA straw (A) yellow   Clarity, UA clear clear   Glucose, UA negative negative mg/dL   Bilirubin, UA negative negative   Ketones, POC UA moderate (40) (A) negative mg/dL   Spec Grav, UA 8.974 8.989 - 1.025   Blood, UA negative negative   pH, UA 6.0 5.0 - 8.0   POC PROTEIN,UA negative negative, trace   Urobilinogen, UA 0.2 0.2 or 1.0 E.U./dL   Nitrite, UA Negative Negative   Leukocytes, UA Negative Negative  POCT urine pregnancy   Collection Time: 05/15/24 10:36 AM  Result Value Ref Range   Preg Test, Ur Negative Negative  POC SARS Coronavirus Ag   Collection Time: 05/15/24 10:53 AM  Result Value Ref Range   SARS Coronavirus 2 Ag Negative Negative  POC Influenza A/B   Collection Time: 05/15/24 10:53 AM  Result Value Ref Range   Influenza A, POC Negative Negative   Influenza B, POC Negative Negative    If needed: Meds ordered this encounter  Medications   ondansetron  (ZOFRAN -ODT) 4 MG disintegrating tablet    Sig: Take 1 tablet (4 mg total) by mouth every 8 (eight) hours as needed for nausea or vomiting.    Dispense:  15 tablet    Refill:  0   Work note provided.  Discussed typical duration of symptoms for suspected viral GI illness. Will do her best to ensure adequate fluid intake in order to avoid dehydration. Will proceed to the Emergency Department for evaluation if unable to tolerate PO fluids regularly.  Otherwise she will f/u with her PCP or here if not showing improvement over the next 48-72 hours.  Reviewed expectations re: course of current medical issues.  Questions answered. Outlined signs and symptoms indicating need for more acute intervention. Patient verbalized understanding. After Visit Summary given.   SUBJECTIVE: History from: patient.  Kristie Wolf is a 34 y.o. female who presents non-bloody diarrhea throughout yesterday evening. Mild nausea with one emesis. Denies abd pain. Denies fever. Feels weak. No tx PTA.  No LMP recorded.  Past Surgical History:  Procedure Laterality Date   CESAREAN SECTION N/A 06/23/2022   Procedure: CESAREAN SECTION;  Surgeon: Armond Cape, MD;  Location: [facility];  Service: Obstetrics;  Laterality: N/A;   INCISION AND DRAINAGE ABSCESS Left 11/20/2012   Procedure: INCISION AND DRAINAGE LEFT BREAST ABSCESS;  Surgeon: Donnice KATHEE Lunger, MD;  Location: [facility];  Service: General;  Laterality: Left;   WISDOM TOOTH EXTRACTION       OBJECTIVE:  Vitals:   05/15/24 1005  BP: 125/81  Pulse: 77  Resp: 20  Temp: 98.6 F (37 C)  TempSrc: Oral  SpO2: 97%    General appearance: alert; no distress Oropharynx: slightly dry Lungs: clear to auscultation bilaterally; unlabored Heart: regular rate and rhythm Abdomen: soft; non-distended; NT Back: no CVA tenderness Extremities: no edema; symmetrical with no gross deformities Skin: warm; dry Neurologic: normal gait Psychological: alert and cooperative; normal mood and affect  Labs: Results for orders placed or performed during the hospital encounter of 05/15/24  POC Urinalysis Dipstick  Collection Time: 05/15/24 10:35 AM  Result Value Ref Range   Color, UA straw (A) yellow   Clarity, UA clear clear   Glucose, UA negative negative mg/dL   Bilirubin, UA negative negative   Ketones, POC UA moderate (40) (A) negative mg/dL   Spec Grav, UA 8.974 8.989 - 1.025   Blood, UA negative negative   pH, UA 6.0 5.0 - 8.0   POC PROTEIN,UA negative negative, trace   Urobilinogen, UA 0.2 0.2 or 1.0 E.U./dL   Nitrite, UA Negative Negative   Leukocytes, UA  Negative Negative  POCT urine pregnancy   Collection Time: 05/15/24 10:36 AM  Result Value Ref Range   Preg Test, Ur Negative Negative  POC SARS Coronavirus Ag   Collection Time: 05/15/24 10:53 AM  Result Value Ref Range   SARS Coronavirus 2 Ag Negative Negative  POC Influenza A/B   Collection Time: 05/15/24 10:53 AM  Result Value Ref Range   Influenza A, POC Negative Negative   Influenza B, POC Negative Negative   Labs Reviewed  POCT URINE DIPSTICK - Abnormal; Notable for the following components:      Result Value   Color, UA straw (*)    Ketones, POC UA moderate (40) (*)    All other components within normal limits  POCT URINE PREGNANCY  POC SOFIA SARS ANTIGEN FIA  POCT INFLUENZA A/B    Imaging: No results found.  Allergies[1]                                             Past Medical History:  Diagnosis Date   Abscess of breast    LEFT   Obesity    Vitamin D deficiency    Social History   Socioeconomic History   Marital status: Single    Spouse name: NA   Number of children: 0   Years of education: 16   Highest education level: Bachelor's degree (e.g., BA, AB, BS)  Occupational History   Not on file  Tobacco Use   Smoking status: Every Day    Types: E-cigarettes   Smokeless tobacco: Never  Vaping Use   Vaping status: Every Day   Last attempt to quit: 07/07/2021   Substances: Nicotine, Flavoring  Substance and Sexual Activity   Alcohol use: Not Currently    Alcohol/week: 1.0 standard drink of alcohol    Types: 1 Standard drinks or equivalent per week    Comment: occassionally   Drug use: Not Currently    Frequency: 7.0 times per week    Types: Marijuana    Comment: last use weed early sep 2023   Sexual activity: Yes    Partners: Male, Female    Birth control/protection: None  Other Topics Concern   Not on file  Social History Narrative   Not on file   Social Drivers of Health   Tobacco Use: High Risk (05/15/2024)   Patient History    Smoking  Tobacco Use: Every Day    Smokeless Tobacco Use: Never    Passive Exposure: Not on file  Financial Resource Strain: Not on File (10/18/2021)   Received from [employer]    Financial Resource Strain: 0  Food Insecurity: Not on File (01/26/2023)   Received from [facility] Insecurity    Food: 0  Transportation Needs: No Transportation Needs (06/21/2022)  PRAPARE - Administrator, Civil Service (Medical): No    Lack of Transportation (Non-Medical): No  Physical Activity: Not on File (10/18/2021)   Received from [facility]   Physical Activity    Physical Activity: 0  Stress: Not on File (10/18/2021)   Received from [facility]   Stress    Stress: 0  Social Connections: Not on File (01/14/2023)   Received from [facility]   Social Connections    Connectedness: 0  Intimate Partner Violence: Not At Risk (06/21/2022)   Humiliation, Afraid, Rape, and Kick questionnaire    Fear of Current or Ex-Partner: No    Emotionally Abused: No    Physically Abused: No    Sexually Abused: No  Depression (PHQ2-9): Not on file  Alcohol Screen: Not on file  Housing: Low Risk (06/21/2022)   Housing    Last Housing Risk Score: 0  Utilities: Not At Risk (06/21/2022)   [facility] Utilities    Threatened with loss of utilities: No  Health Literacy: Not on file   Family History  Problem Relation Age of Onset   Hypertension Father    Diabetes Father    Cancer Paternal Grandfather    Asthma Neg Hx    Heart disease Neg Hx    Stroke Neg Hx        [1] No Known Allergies    Rolinda Rogue, MD 05/15/24 1132  "

## 2024-05-15 NOTE — Discharge Instructions (Signed)
Please do your best to ensure adequate fluid intake in order to avoid dehydration. If you find that you are unable to tolerate drinking fluids regularly please proceed to the Emergency Department for evaluation. ° ° °
